# Patient Record
Sex: Male | Born: 1955 | Race: White | Hispanic: No | Marital: Single | Smoking: Never smoker
Health system: Southern US, Community
[De-identification: ages and names within clinical notes are randomized; demographics above are authoritative.]

## PROBLEM LIST (undated history)

## (undated) DIAGNOSIS — I10 Essential (primary) hypertension: Secondary | ICD-10-CM

## (undated) HISTORY — PX: ANKLE SURGERY: SHX546

---

## 1999-09-18 ENCOUNTER — Encounter: Payer: Self-pay | Admitting: *Deleted

## 1999-09-18 ENCOUNTER — Encounter: Admission: RE | Admit: 1999-09-18 | Discharge: 1999-09-18 | Payer: Self-pay | Admitting: *Deleted

## 1999-09-26 ENCOUNTER — Encounter: Admission: RE | Admit: 1999-09-26 | Discharge: 1999-11-23 | Payer: Self-pay | Admitting: *Deleted

## 2001-01-07 ENCOUNTER — Encounter: Payer: Self-pay | Admitting: Internal Medicine

## 2001-01-07 ENCOUNTER — Ambulatory Visit (HOSPITAL_COMMUNITY): Admission: RE | Admit: 2001-01-07 | Discharge: 2001-01-07 | Payer: Self-pay | Admitting: Internal Medicine

## 2001-05-22 ENCOUNTER — Encounter: Admission: RE | Admit: 2001-05-22 | Discharge: 2001-08-20 | Payer: Self-pay | Admitting: Internal Medicine

## 2001-07-08 ENCOUNTER — Encounter: Payer: Self-pay | Admitting: Occupational Medicine

## 2001-07-08 ENCOUNTER — Encounter: Admission: RE | Admit: 2001-07-08 | Discharge: 2001-07-08 | Payer: Self-pay | Admitting: Occupational Medicine

## 2003-01-12 ENCOUNTER — Encounter: Payer: Self-pay | Admitting: Internal Medicine

## 2003-01-12 ENCOUNTER — Encounter: Admission: RE | Admit: 2003-01-12 | Discharge: 2003-01-12 | Payer: Self-pay | Admitting: Internal Medicine

## 2004-04-03 ENCOUNTER — Encounter: Admission: RE | Admit: 2004-04-03 | Discharge: 2004-04-03 | Payer: Self-pay | Admitting: Orthopaedic Surgery

## 2004-05-03 ENCOUNTER — Inpatient Hospital Stay (HOSPITAL_COMMUNITY): Admission: EM | Admit: 2004-05-03 | Discharge: 2004-05-05 | Payer: Self-pay | Admitting: Emergency Medicine

## 2004-05-06 ENCOUNTER — Encounter: Admission: RE | Admit: 2004-05-06 | Discharge: 2004-05-06 | Payer: Self-pay | Admitting: Orthopaedic Surgery

## 2004-05-08 ENCOUNTER — Ambulatory Visit (HOSPITAL_COMMUNITY): Admission: RE | Admit: 2004-05-08 | Discharge: 2004-05-08 | Payer: Self-pay | Admitting: Neurology

## 2004-06-23 ENCOUNTER — Emergency Department (HOSPITAL_COMMUNITY): Admission: EM | Admit: 2004-06-23 | Discharge: 2004-06-23 | Payer: Self-pay | Admitting: Emergency Medicine

## 2004-06-30 ENCOUNTER — Emergency Department (HOSPITAL_COMMUNITY): Admission: EM | Admit: 2004-06-30 | Discharge: 2004-07-01 | Payer: Self-pay | Admitting: *Deleted

## 2004-07-06 ENCOUNTER — Encounter: Admission: RE | Admit: 2004-07-06 | Discharge: 2004-07-06 | Payer: Self-pay | Admitting: Neurology

## 2004-07-14 ENCOUNTER — Emergency Department (HOSPITAL_COMMUNITY): Admission: EM | Admit: 2004-07-14 | Discharge: 2004-07-14 | Payer: Self-pay | Admitting: Emergency Medicine

## 2004-08-30 ENCOUNTER — Ambulatory Visit (HOSPITAL_COMMUNITY): Admission: RE | Admit: 2004-08-30 | Discharge: 2004-08-30 | Payer: Self-pay | Admitting: Neurology

## 2004-09-07 ENCOUNTER — Ambulatory Visit (HOSPITAL_COMMUNITY): Admission: RE | Admit: 2004-09-07 | Discharge: 2004-09-07 | Payer: Self-pay | Admitting: Cardiology

## 2004-09-07 ENCOUNTER — Encounter (INDEPENDENT_AMBULATORY_CARE_PROVIDER_SITE_OTHER): Payer: Self-pay | Admitting: Cardiology

## 2004-10-12 ENCOUNTER — Ambulatory Visit (HOSPITAL_COMMUNITY): Admission: RE | Admit: 2004-10-12 | Discharge: 2004-10-12 | Payer: Self-pay | Admitting: Internal Medicine

## 2005-12-02 ENCOUNTER — Emergency Department (HOSPITAL_COMMUNITY): Admission: EM | Admit: 2005-12-02 | Discharge: 2005-12-02 | Payer: Self-pay | Admitting: Emergency Medicine

## 2005-12-04 ENCOUNTER — Encounter: Admission: RE | Admit: 2005-12-04 | Discharge: 2005-12-04 | Payer: Self-pay | Admitting: Internal Medicine

## 2006-09-13 ENCOUNTER — Emergency Department (HOSPITAL_COMMUNITY): Admission: EM | Admit: 2006-09-13 | Discharge: 2006-09-13 | Payer: Self-pay | Admitting: Emergency Medicine

## 2007-10-12 ENCOUNTER — Emergency Department (HOSPITAL_COMMUNITY): Admission: EM | Admit: 2007-10-12 | Discharge: 2007-10-12 | Payer: Self-pay | Admitting: Emergency Medicine

## 2008-02-18 ENCOUNTER — Observation Stay (HOSPITAL_COMMUNITY): Admission: EM | Admit: 2008-02-18 | Discharge: 2008-02-19 | Payer: Self-pay | Admitting: Emergency Medicine

## 2008-02-18 ENCOUNTER — Ambulatory Visit: Payer: Self-pay | Admitting: Family Medicine

## 2008-03-09 ENCOUNTER — Encounter (HOSPITAL_COMMUNITY): Admission: RE | Admit: 2008-03-09 | Discharge: 2008-04-08 | Payer: Self-pay | Admitting: Internal Medicine

## 2008-03-29 ENCOUNTER — Ambulatory Visit: Payer: Self-pay | Admitting: Cardiology

## 2008-04-27 ENCOUNTER — Ambulatory Visit: Payer: Self-pay | Admitting: Cardiology

## 2009-06-11 ENCOUNTER — Emergency Department (HOSPITAL_COMMUNITY): Admission: EM | Admit: 2009-06-11 | Discharge: 2009-06-12 | Payer: Self-pay | Admitting: Emergency Medicine

## 2009-12-30 ENCOUNTER — Emergency Department (HOSPITAL_COMMUNITY): Admission: EM | Admit: 2009-12-30 | Discharge: 2009-12-30 | Payer: Self-pay | Admitting: Emergency Medicine

## 2010-10-03 ENCOUNTER — Emergency Department (HOSPITAL_COMMUNITY)
Admission: EM | Admit: 2010-10-03 | Discharge: 2010-10-03 | Payer: Self-pay | Source: Home / Self Care | Admitting: Emergency Medicine

## 2010-10-17 ENCOUNTER — Encounter (INDEPENDENT_AMBULATORY_CARE_PROVIDER_SITE_OTHER): Payer: Self-pay | Admitting: *Deleted

## 2010-10-26 NOTE — Letter (Signed)
Summary: Pre Visit Letter Revised  [facility]  [address] [place], [place] [address]   Phone: [phone]  Fax: [phone]        10/17/2010 MRN: [id number] Kenneth Walker [address] [place], [place]  [address]             Procedure Date: 11-28-10   Welcome to the Gastroenterology Division at [facility].    You are scheduled to see a nurse for your pre-procedure visit on 11-14-10 at 10:30A.M. on the 3rd floor at [facility], [address]  We ask that you try to arrive at our office 15 minutes prior to your appointment time to allow for check-in.  Please take a minute to review the attached form.  If you answer "Yes" to one or more of the questions on the first page, we ask that you call the person listed at your earliest opportunity.  If you answer "No" to all of the questions, please complete the rest of the form and bring it to your appointment.    Your nurse visit will consist of discussing your medical and surgical history, your immediate family medical history, and your medications.   If you are unable to list all of your medications on the form, please bring the medication bottles to your appointment and we will list them.  We will need to be aware of both prescribed and over the counter drugs.  We will need to know exact dosage information as well.    Please be prepared to read and sign documents such as consent forms, a financial agreement, and acknowledgement forms.  If necessary, and with your consent, a friend or relative is welcome to sit-in on the nurse visit with you.  Please bring your insurance card so that we may make a copy of it.  If your insurance requires a referral to see a specialist, please bring your referral form from your primary care physician.  No co-pay is required for this nurse visit.     If you cannot keep your appointment, please call [phone] to cancel or reschedule prior to your appointment date.  This allows  [place] the opportunity to schedule an appointment for another patient in need of care.    Thank you for choosing [facility] for your medical needs.  We appreciate the opportunity to care for you.  Please visit [place] at our website  to learn more about our practice.  Sincerely, The Gastroenterology Division

## 2010-11-28 ENCOUNTER — Other Ambulatory Visit: Payer: Self-pay | Admitting: Gastroenterology

## 2010-12-14 LAB — POCT CARDIAC MARKERS
CKMB, poc: 1 ng/mL (ref 1.0–8.0)
Myoglobin, poc: 66.6 ng/mL (ref 12–200)
Troponin i, poc: <0.05 ng/mL (ref 0.00–0.09)

## 2011-01-23 NOTE — Letter (Signed)
March 29, 2008    Kenneth Walker. Kenneth Sills, MD  [address]  [place], [place] [address]   RE:  Kenneth, Walker  MRN:  [id number]  /  DOB:  1956-03-17   Dear Kenneth Walker,   It was my pleasure evaluating Kenneth Walker in the office today at your  request for chest discomfort and an abnormal stress test.  As you know,  this nice gentleman has a history of TIA in 2005.  He developed  expressive aphasia and was evaluated by Dr. Gerilyn Pilgrim.  None of his tests  revealed an etiology for his symptoms.  He has done well since.   He describes some generalized anxiety, but has not been diagnosed with  anxiety disorder.  He has never used psychoactive medications nor does  he use alcohol or illicit drugs.   He developed fairly localized left upper chest pain at work that lasted  most of the day.  This was exacerbated by movement of his left arm.  He  also noted weakness in his left arm such that he was using his right for  most of his tasks.  There is no chest wall tenderness.  There was no  pleuritic component to the discomfort, which was mild-to-moderate.  His  symptoms eventually faded, but he sought evaluation in the emergency  department.  He was kept in hospital overnight.  Myocardial infarction  was ruled out.  He subsequently underwent stress testing, which revealed  a lateral wall defect with some reversibility.   PAST MEDICAL HISTORY:  Notable for mild hypertension, mild diabetes and  mild hyperlipidemia.  All of these are well-controlled with medical  therapy.   Past medical history is positive only for a minor left ankle surgery.   ALLERGIES:  He reports an allergy to PENICILLIN that caused urticaria.   CURRENT MEDICATIONS:  1. Metformin 500 mg b.i.d.  2. Atenolol 25 mg daily.  3. Quinapril 40 mg daily.  4. Aggrenox 200/25 mg b.i.d.  5. Simvastatin 20 mg daily.   SOCIAL HISTORY:  Works as a [employer] fundraiser; unmarried with no  children.  No use of alcohol or tobacco products.   FAMILY  HISTORY:  Father is deceased due to Hodgkin disease; his mother  is alive and well as is one brother.   REVIEW OF SYSTEMS:  Activity has been self-limited lately due to his  chest discomfort and subsequent evaluation.  All other systems are  reviewed and are negative.   PHYSICAL EXAMINATION:  GENERAL:  On exam, pleasant, somewhat overweight  gentleman, in no acute distress.  VITAL SIGNS:  The weight is 282, height 601 inches, heart rate 75 and  regular, and blood pressure 125/85.  HEENT:  Anicteric sclerae; normal oral mucosa.  NECK:  No jugular venous distention; normal carotid upstrokes without  bruits.  ENDOCRINE:  No thyromegaly.  HEMATOPOIETIC:  No adenopathy.  SKIN:  No significant lesions.  LUNGS:  Clear.  CARDIAC:  Normal first and second heart sounds; fourth heart sound  present.  ABDOMEN:  Soft and nontender; no masses; no organomegaly.  EXTREMITIES:  No edema; normal distal pulses.   EKG:  Normal sinus rhythm; within normal limits except for delayed R-  wave progression - cannot exclude previous anterior myocardial  infarction   IMPRESSION:  Mr.  Walker does have multiple cardiovascular risk factors,  but he is relatively young, has a benign exam and a normal EKG.  His  chest discomfort is atypical and most consistent with a musculoskeletal  etiology.  He also reports that his symptoms are exacerbated by anxiety.  I do not think he requires cardiac catheterization at this time.  We  will treat him with  nonsteroidal anti-inflammatory agents and benzodiazepines.  I will  reassess his symptoms in 1 month.   Thank you so much for sending this nice gentleman to see me.    Sincerely,      Gerrit Friends. Dietrich Pates, MD, [facility]  Electronically Signed    RMR/MedQ  DD: 03/29/2008  DT: 03/30/2008  Job #: [id number]

## 2011-01-23 NOTE — Letter (Signed)
April 27, 2008    Kingsley Callander. Ouida Sills, MD  [address]  [place], [place] [address] Val Eagle. Ouida Sills, MD   RE:  [name], [name]  MRN:  [id number]  /  DOB:  1956-06-03   Dear Kenneth Walker,   Mr. Gudino returns to the office for reassessment of chest discomfort.  He continues to have some mild intermittent symptoms that are promptly  relieved with over-the-counter analgesics.  Diabetic control has been  very good as has been control of blood pressure.  His last lipid profile  was excellent on low-dose simvastatin.  He has been using Ativan  approximately once per day with benefit.   Medications are otherwise unchanged from his last visit.   PHYSICAL EXAMINATION:  GENERAL:  [place] gentleman in no acute distress.  VITAL SIGNS:  The weight is 284, 2 pounds more than in July.  Blood  pressure 120/80, heart rate 60 and regular, respirations 14.  NECK:  No jugular venous distention; no carotid bruits.  LUNGS:  Clear.  CARDIAC:  Split first heart sound; normal second heart sound; normal  PMI.  ABDOMEN:  Soft and nontender; no organomegaly.  EXTREMITIES:  Trace edema.   IMPRESSION:  Kenneth Walker is doing well.  There is no compelling reason to  think he has coronary disease or any other cardiac issues.  He is  receiving appropriate medications to minimize his risk of developing  vascular disease.  Please let me know at any time if I can provide  assistance in the care of this nice gentleman.    Sincerely,      Gerrit Friends. Dietrich Pates, MD, [facility]  Electronically Signed    RMR/MedQ  DD: 04/27/2008  DT: 04/28/2008  Job #: [id number]

## 2011-01-23 NOTE — Procedures (Signed)
NAME:  Kenneth Walker, Kenneth Walker NO.:  [id number]   MEDICAL RECORD NO.:  [id number]          PATIENT TYPE:  REC   LOCATION:  RAD                           FACILITY:  [facility]   PHYSICIAN:  Kingsley Callander. Ouida Sills, MD       DATE OF BIRTH:  [birth year]   DATE OF PROCEDURE:  03/09/2008  DATE OF DISCHARGE:                                  STRESS TEST   [name] underwent a Myoview stress test to evaluate recent episodes  of chest pain.  He had been ruled out for an MI at [facility].  He exercised  for 7 minutes 31 seconds (1 minute 31 seconds in stage III of the Bruce  protocol) obtaining a maximal heart rate of 160 (95% at the age  predicted maximal heart rate) and a workload of 10.1 METS and  discontinued exercise after surpassing his target heart rate.  There  were no symptoms of chest pain.  There were no arrhythmias.  There were  no ST-segment changes diagnostic of ischemia.  His baseline EKG revealed  normal sinus rhythm at 72 beats per minute with poor R-wave progression.   IMPRESSION:  No evidence of exercise-induced ischemia.  Myoview images  pending.      Kingsley Callander. Ouida Sills, MD  Electronically Signed     ROF/MEDQ  D:  03/09/2008  T:  03/09/2008  Job:  [id number]

## 2011-01-23 NOTE — H&P (Signed)
NAME:  Kenneth Walker, Kenneth Walker NO.:  [id number]   MEDICAL RECORD NO.:  [id number]          PATIENT TYPE:  INP   LOCATION:  2037                         FACILITY:  [facility]   PHYSICIAN:  Kenneth Walker, M.D.DATE OF BIRTH:  1955/11/03   DATE OF ADMISSION:  02/18/2008  DATE OF DISCHARGE:                              HISTORY & PHYSICAL   CHIEF COMPLAINT:  Chest pain.   PRIMARY CARE PHYSICIAN:  Dr. Kingsley Callander. Ouida Sills, MD, in [place].   HISTORY OF PRESENT ILLNESS:  This is a 55 year old male with history of  diabetes, hypertension, and hyperlipidemia who presents with a one day  history of chest pain characterized as exertional and motion dependent,  i.e., pulling himself into a car closing the back door of his car or  sharp turn of steering brings on pains.  It is also relieved by rest,  i.e., sitting in a car.  Begins in the left superior chest wall and  radiates to the right chest wall.  No radiation to back.  Drive the  steering.  No pressure.  The patient denies diaphoresis, nausea,  vomiting, pleurisy, or fevers.  He has never had similar symptoms like  this before.  The patient states it is very motion dependent.   REVIEW OF SYSTEMS:  No fevers, cough, chills, shortness of breath,  diarrhea, constipation, urinary changes, muscle pain, otherwise no rash.  No vision changes.  Other review of system is negative except for HPI.   MEDICATIONS:  1. Metformin 500 b.i.d.  2. Atenolol 25 mg daily.  3. Quinapril 40 mg daily.  4. Aggrenox 200/25 mg daily.  5. Zocor 20 mg daily.  6. Etodolac 400 mg p.r.n.   ALLERGIES:  Penicillin causes hives.   PAST MEDICAL HISTORY:  1. TIA several times in the past last one in 2006.  2. Hypertension.  3. Diabetes.  4. Arthritis.  5. Hyperlipidemia.   PAST SURGICAL HISTORY:  The patient with history of ankle surgery for  bone spread.   FAMILY HISTORY:  Hypertension and diabetes runs in the family.  The  patient's father died of  Hodgkin disease in 56.  The patient denies  any known family history of heart disease.   SOCIAL HISTORY:  The patient lives in [place] with his mother.  Denies smoking or alcohol.  Denies recreational drugs.   PHYSICAL EXAMINATION:  VITAL SIGNS:  Temperature 98.1, heart rate 83,  respiratory rate 20, blood pressure 149/81, pulse ox 97% on room air.  GENERAL:  Obese, Caucasian male, stocky, in no acute distress.  HEENT:  Pupils equal, round, and reactive to light.  Extraocular  movements intact.  No pharyngeal erythema or edema.  Moist mucous  membranes.  No lymphadenopathy.  CVS:  Normal S1, S2.  No murmurs, rubs, or gallops.  No pain on  palpation of left superior or lateral chest wall, but the patient does  endorse soreness.  PULMONARY:  Clear to auscultation bilaterally.  No crackles or wheezing.  ABDOMEN:  Soft, nontender, nondistended.  No mass.  No  hepatosplenomegaly felt.  EXTREMITIES:  No clubbing, cyanosis, or edema.  SKIN:  No rash or dryness   LABORATORY DATA:  White blood cell 6.1, hemoglobin 12.8, hematocrit  38.4, platelet 278, sodium 121, potassium 3.6, chloride 109, bicarb 23,  BUN 10, creatinine 1.1, glucose 129, myoglobin 126-72, CK-MB 3.2 to less  than 1, troponin I less than 0.05 x2.   IMAGING:  Chest x-ray showing no active lung disease.  EKG within normal  limits.  Poor R-wave progression, unchanged from previous.   ASSESSMENT AND PLAN:  This is a 55 year old male with history of  hypertension, hyperlipidemia, and diabetes who presents with chest pain  which sounds musculoskeletal.  Given risks factor, we will for 24 hours  and rule out for cardiac cause.  1. Chest pain.  Initial, EKG and point of care enzymes x2 not      concerning for cardiac cause; however, the patient does have      significant risk factors for heart disease.  We will admit him.      Risks stratify with the fasting lipid panel in the morning.  Repeat      EKG in the a.m. and  continue to cycle cardiac enzymes.  The patient      is currently on a statin.  Monitored on telemetry.  2. Hypertension.  Continue home regimen atenolol and Quinapril monitor      with vitals.  3. Hyperlipidemia.  Continue statins, check fasting lipid panel.      Currently on low-dose statin.  4. Diabetes.  Continue metformin 500 b.i.d., monitor with CBGs.  5. History of transient ischemic attacks.  The patient states he is      currently on Aggrenox.  This may be possibly due to failed aspirin      treatment previously.  Continue Aggrenox.  6. Arthritis, Tylenol.  7. Fluid, electrolyte, nutrition, heart-healthy, carb modified diet,      hep-lock IV.  8. Pending cardiac rule out.      Eustaquio Boyden, MD  Electronically Signed      Kenneth Bumpers. Leveda Anna, M.D.  Electronically Signed    JG/MEDQ  D:  02/18/2008  T:  02/19/2008  Job:  [id number]

## 2011-01-23 NOTE — Discharge Summary (Signed)
NAME:  Kenneth Walker, Kenneth Walker NO.:  [id number]   MEDICAL RECORD NO.:  [id number]          PATIENT TYPE:  INP   LOCATION:  2037                         FACILITY:  [facility]   PHYSICIAN:  Wayne A. Sheffield Slider, M.D.    DATE OF BIRTH:  1955/10/06   DATE OF ADMISSION:  02/18/2008  DATE OF DISCHARGE:  02/19/2008                               DISCHARGE SUMMARY   PRIMARY CARE PHYSICIAN:  Kingsley Callander. Ouida Sills, MD, [facility].   DISCHARGE DIAGNOSES:  1. Noncoronary chest pain, likely musculoskeletal.  2. Hypertension.  3. Hyperlipidemia.  4. History of transient ischemic attacks.  5. Diabetes.  6. Arthritis.   DISCHARGE MEDICATIONS:  1. Metformin 500 mg b.i.d.  2. Atenolol 25 mg daily.  3. Quinapril 40 mg daily.  4. Aggrenox 200/25 mg b.i.d.  5. Zocor 20 mg daily.  6. Etodolac 400 mg as needed for pain.   CONSULTS:  None.   IMAGING:  Chest x-ray showing no active lung disease.  EKG is with poor  R-wave progression but normal sinus rhythm, unchanged from previous x2.   ADMISSION LABS:  Sodium 141, potassium 3.6, chloride 109, bicarb 23, BUN  10, creatinine 1.1, and glucose 129.  White blood cells 6.1, hemoglobin  12.8, platelets 278, and myoglobin 126 to 72.  CK-MB 3.2 to less than 1.  Troponin I less than 0.05 x2.  Cardiac enzymes negative x3.   DISCHARGE LABS:  Lipid panel fasting showing cholesterol 125,  triglycerides 65, HDL 32, and LDL 80.   HOSPITAL COURSE:  For full summary, please see dictated H and P in  chart.  This is a 55 year old male with history of diabetes,  hypertension, hyperlipidemia, who presented with chest pain.  1. Chest pain.  The patient presented with what sounded like      exertional chest pain relieved with rest but it was not substernal.      Given presentation, the patient was admitted for rule out chest      pain.  He was in fact to be ruled out of cardiac cause with cardiac      enzymes negative x3 and normal EKGs x2.  Given additional risk  factors including hypertension, hyperlipidemia, and diabetes, the      patient would likely benefit from further outpatient workup with an      exercise treadmill test or other stress testing.  The patient      states he is currently plugged into [facility] and would like to      followup as outpatient clinic there.  The patient also to followup      with Dr. Ouida Sills, his PCP.  2. Hypertension.  The patient continued on home medications with good      control of blood pressures.  3. Hyperlipidemia.  The patient on Zocor 20.  Fasting lipid panel      showing low HDL at 32 and good LDL at 80.  4. Diabetes.  The patient continued on metformin with good tolerance.      Also, controlled with diet.   FOLLOWUP:  The patient to follow up with Dr.  Fagan and to call next week  for an appointment.  The patient also to pursue further outpatient  testing of cardiac cause of chest pain, either at [place] [facility] [place] or in  [place].  The patient understands and amenable to this.      Eustaquio Boyden, MD  Electronically Signed      Arnette Norris. Sheffield Slider, M.D.  Electronically Signed    JG/MEDQ  D:  02/19/2008  T:  02/20/2008  Job:  [id number]   cc:   Kingsley Callander. Ouida Sills, MD

## 2011-01-26 NOTE — Discharge Summary (Signed)
NAME:  Kenneth Walker, LINKER NO.:  [id number]   MEDICAL RECORD NO.:  [id number]                   PATIENT TYPE:   LOCATION:                                       FACILITY:   PHYSICIAN:  Kingsley Callander. Ouida Sills, M.D.                  DATE OF BIRTH:   DATE OF ADMISSION:  05/03/2004  DATE OF DISCHARGE:  05/05/2004                                 DISCHARGE SUMMARY   DISCHARGE DIAGNOSES:  1. Transient ischemic attack.  2. Type 2 diabetes.  3. Hypertension.   PROCEDURES:  1. CT scan of the head.  2. MRI of the brain.  3. Carotid ultrasound.  4. Echocardiogram.   HOSPITAL COURSE:  This patient is a 55 year old white male who presented to  the emergency room with expressive aphasia.  His symptoms resolved after  approximately four hours.  An initial CT scan of the head was negative.  He  was hospitalized in a monitored setting.  There were no cardiac arrhythmias.  A carotid ultrasound revealed no hemodynamically significant stenoses.  An  MRI of the brain revealed small vessel ischemic changes only.  He was seen  in neurology consultation by Dr. Gerilyn Pilgrim.  An echocardiogram and an EEG  plus a homocystine level and fasting lipid profile are pending.  He was  treated with Plavix instead of aspirin.  He had been on Accupril and  Atenolol at home.  He experienced a low-grade fever.  His chest x-ray was  negative.  His urinalysis was negative.  There were no localizing findings  on his physical exam.  He was afebrile and stable for discharge on the  evening of May 05, 2004.   He will be seen in followup in the office in one week.  He will come back  tomorrow for an EEG.   He was instructed to return promptly to the emergency room for any recurrent  aphasia or other stroke type symptoms including weakness or sensory changes  in an arm or a leg.   DISCHARGE MEDICATIONS:  1. Plavix 75 mg every day.  2. Accupril 40 mg every day.  3. Atenolol 25 mg every day.  4.  Glucophage 500 mg b.i.d.   He will be seen in the office in one week.    ___________________________________________                                         Kingsley Callander. Ouida Sills, M.D.   ROF/MEDQ  D:  05/05/2004  T:  05/06/2004  Job:  [id number]

## 2011-01-26 NOTE — Procedures (Signed)
NAME:  Kenneth Walker, Kenneth Walker NO.:  [id number]   MEDICAL RECORD NO.:  [id number]                   PATIENT TYPE:  INP   LOCATION:  A226                                 FACILITY:  [facility]   PHYSICIAN:  Richard A. Alanda Amass, M.D.          DATE OF BIRTH:  [birth year]   DATE OF PROCEDURE:  05/05/2004  DATE OF DISCHARGE:                                  ECHOCARDIOGRAM   TECHNICAL QUALITY:  Technically adequate.   HISTORY:  This 55 year old gentleman has a history of hypertension and  possible transient ischemic attack.   FINDINGS:  1. The aorta is normal at 3.5 cm.  2. The aortic valve has three leaflets with normal opening.  There is no     aortic stenosis and no significant AI present.  3. The valves appear thin and trileaflet.  4. The left atrium is enlarged to 4.8 cm which is mildly to moderately     enlarged.  There are no left atrial clots seen, and the patient was in     sinus rhythm during the study.  5. IVS and LVPW are minimally asymmetrically thickened to 1.3 and 1.1 cm     each.  There is normal contraction pattern.  6. Left ventricular internal dimension are WNL.  7. LVIDD equals 4.6 cm, LVISD equals 3.2 cm.  There is normal thickening of     all visualized segments, and no segmental wall motion abnormalities seen.     The estimated EF is greater than 55%.  LV inflow signal is normal with no     evidence of diastolic relaxation abnormality.  8. Mitral valve opens normally.  The leaflets are thin, and there is no     mitral valve prolapse.  There is trivial MR present.  9. Tricuspid valve is normal, and there is mild TR present.  10.      The right ventricle appears normal in thickness and in size.  11.      Normal ejection fraction.  12.      The right atrium appears normal.  The tricuspid and mitral valves     are normally positioned.  13.      There is no pericardial effusion.  14.      Pulmonary valve appears normal with no significant PI.  15.       There is thinning of the intra-atrial septum normally near the     fossa, but there is no ASD seen, and no flow across the atrial septum     seen.  16.      There is no pericardial effusion.   A 2-D echocardiogram is essentially within normal limits.  There is no  significant valvular abnormality.  Normal RV and LV systolic function and no  evidence of diastolic relaxation abnormality.  There is very minimal  asymmetric  septal hypertrophy which may not be outside the limits of normal.  There are  no atrial clots seen.  No source of thrombus noted.   If there is further clinical concern, TEE may be helpful.      ___________________________________________                                            Pearletha Furl. Alanda Amass, M.D.   RAW/MEDQ  D:  05/05/2004  T:  05/06/2004  Job:  [id number]   cc:   Kingsley Callander. Ouida Sills, M.D.  [address]  [place]  [place] [address]  Fax: [phone]   Kofi A. Gerilyn Pilgrim, M.D.  [address] [place]  [place]  [place] [address]  Fax: [phone]

## 2011-01-26 NOTE — H&P (Signed)
NAME:  GURSHAAN, MATSUOKA NO.:  [id number]   MEDICAL RECORD NO.:  [id number]                   PATIENT TYPE:  EMS   LOCATION:  ED                                   FACILITY:  [facility]   PHYSICIAN:  Edward L. Juanetta Gosling, M.D.             DATE OF BIRTH:  10-01-1955   DATE OF ADMISSION:  05/03/2004  DATE OF DISCHARGE:                                HISTORY & PHYSICAL   REASON FOR ADMISSION:  Aphasia.   HISTORY:  Mr. Schlarb is a 55 year old patient of Dr. Alonza Smoker.  He came to  the emergency room with the acute onset of aphasia.  The aphasia was very  unusual in that he has episodes of what sounds like almost stuttering speech  and then he is fluent.  He knows that he is having difficulty with getting  words.  He seems to be searching for words.  He has had a CT in the  emergency room that was normal.   PAST MEDICAL HISTORY:  Positive for hypertension and diabetes.  He has had  an episode of what he was told was heat stroke, but it is not clear exactly  when that was.  He is a little confused about the history.   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  He is a nonsmoker.  He does not use any alcohol.   FAMILY HISTORY:  Negative for any sort of similar problems.   REVIEW OF SYSTEMS:  Otherwise essentially negative.  He has had no numbness,  no pain and no other symptoms.   PHYSICAL EXAMINATION:  VITAL SIGNS:  Blood pressure 150/95, pulse 100,  respirations 24, temperature 98.7 degrees.  HEENT:  Pupils are reactive to light and accommodation.  He does not have  any papilledema.  His nose and throat are clear.  NECK:  He does not have any bruits.  CHEST:  Clear without wheezing, rhonchi, or rales.  HEART:  Regular without murmur, rub, or gallop.  ABDOMEN:  Soft.  No masses are felt.  EXTREMITIES:  No edema.  CENTRAL NERVOUS SYSTEM:  Exam is grossly intact.  He has normal strength.  His only abnormality is his  speech.  His tongue protrudes midline.   LABORATORY DATA:  White count 7200, hemoglobin 13.3.  Comprehensive  metabolic profile shows a glucose of 150.  The prothrombin time is 12.6 and  PTT 32.   ASSESSMENT:  He has a very atypical pattern of aphasia.  The plan is to go  ahead and put him in the hospital, get serial neurological checks and get an  MRI of the brain and go ahead with a neurological consultation.  Dr. Ouida Sills  will resume his care in the morning.  I am going to go ahead and give him  some Plavix.  I am not going to have him on his regular medications at  this  point.     ___________________________________________                                         Oneal Deputy. Juanetta Gosling, M.D.   Gwenlyn Found  D:  05/03/2004  T:  05/03/2004  Job:  [id number]

## 2011-01-26 NOTE — Consult Note (Signed)
NAME:  JHAMIR, PICKUP NO.:  [id number]   MEDICAL RECORD NO.:  [id number]                   PATIENT TYPE:  INP   LOCATION:  A226                                 FACILITY:  [facility]   PHYSICIAN:  Kofi A. Gerilyn Pilgrim, M.D.              DATE OF BIRTH:  02-15-1956   DATE OF CONSULTATION:  DATE OF DISCHARGE:                                   CONSULTATION   REASON FOR CONSULTATION:  The patient's clinical symptoms is most consistent  with a transient ischemic attack.  Other potential etiologies include  migraine, seizures and psychogenic.  These three other differentials are  less likely however.   RECOMMENDATIONS:  In addition to the workup that has been initiated, I am  going to add an echocardiography, fasting lipid profile, homocystine level  and EEG.  I am also going to suggest that aspirin be switched to Plavix.  He  probably does not need both of these combined from a cerebrovascular  standpoint.   HISTORY:  This was a 55 year old Caucasian man who has a history of  diabetes.  He developed the acute onset of difficulty with word finding and  difficulty with speech expression.  He does not report any alteration of  consciousness or problems with what he wants to say.  Essentially, he was  almost mute, just able to say a few words.  He denies any diplopia, visual  disturbance, numbness or weakness.  The patient reportedly had a headache  afterwards.  The headaches are located in bifrontal region.  He denies any  headache at baseline.  He apparently reportedly he got hot and heated  yesterday, and he believes this may have contributed to the event.  The  entire aphasic event apparently lasted for about 3-1/2 hours.  He is now  back to baseline, but is left with residual bifrontal headache.  The patient  has been taking aspirin, 81 mg at home on a consistent basis for this event.   PAST MEDICAL HISTORY:  1. Hypertension.  2. Diabetes.  3. He reports having  a history of a heat stroke in the past.   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  He is a  nonsmoker.  No alcohol use.   FAMILY HISTORY:  Unremarkable.   REVIEW OF SYSTEMS:  As in history of present illness.  Other is  unremarkable.   PHYSICAL EXAMINATION:  GENERAL:  Obese gentleman, in no acute distress.  VITAL SIGNS:  Temperature 100.3.  Pulse 60.  Respirations 20.  Blood  pressure 135/70.  HEENT:  Evaluation shows head is normocephalic, atraumatic.  NECK:  Supple.  LUNGS:  Clear to auscultation bilaterally.  CARDIOVASCULAR:  Exam revealed normal S1 and S2.  ABDOMEN:  Obese but soft.  EXTREMITIES:  No varicosities or edema.  NEUROLOGIC:  Mental status:  The patient is awake, alert. He converses well.  There is no difficulty  with speech, language, comprehension or language  expression.  Naming and repetition are intact.  Cranial nerves II-XII are  intact including visual fields.  Motor examination shows normal tone, bulk  and strength.  There is no pronator drift.  Coordination is intact.  Reflexes are +2 and symmetric.  Plantar reflexes downgoing.  Sensory  examination is normal to light touch and temperature.  Gait is normal.   Images of the brain with CT scan shows no acute findings.  Carotid duplex  Doppler shows normal velocities with no plaque seen.   MRI of the brain is essentially unremarkable.  There are two tiny little  subcortical white matter ischemic changes. There is no adenopathy noted on  diffusion weighted imaging.   WBC is unremarkable other than hematocrit is slightly low at 38.  Chemistry  shows a glucose of 150, otherwise unremarkable, including normal liver  enzymes.      ___________________________________________                                            Perlie Gold Gerilyn Pilgrim, M.D.   KAD/MEDQ  D:  05/04/2004  T:  05/04/2004  Job:  [id number]

## 2011-01-26 NOTE — Procedures (Signed)
NAME:  HERO, KULISH NO.:  [id number]   MEDICAL RECORD NO.:  [id number]                   PATIENT TYPE:   LOCATION:                                       FACILITY:   PHYSICIAN:  Kofi A. Gerilyn Pilgrim, M.D.              DATE OF BIRTH:   DATE OF PROCEDURE:  DATE OF DISCHARGE:                                EEG INTERPRETATION   HISTORY:  This is a 55 year old who presents with episodic speech impairment  and other neurological symptoms suggestive of possible seizures.   ANALYSIS:  A 16 channel recording is conducted for approximately 29 minutes  with a posterior rhythm of 9-1/2 to 10 Hz with attenuation with eye opening.  There is beta activity seen in the frontal areas.  Awake and sleep  activities are noted.  Stage 2 sleep with K complexes and sleep spindles are  noted.  Photic stimulation and hypoventilation does not elicit any abnormal  responses. There is no focal slowing or lateralized slowing or epileptiform  activity noted.   IMPRESSION:  This is a normal recording in the awake and sleep states.      ___________________________________________                                            Perlie Gold Gerilyn Pilgrim, M.D.   KAD/MEDQ  D:  05/08/2004  T:  05/08/2004  Job:  [id number]

## 2011-01-26 NOTE — Group Therapy Note (Signed)
NAME:  Kenneth Walker, TETERS NO.:  [id number]   MEDICAL RECORD NO.:  [id number]                   PATIENT TYPE:  INP   LOCATION:  A226                                 FACILITY:  [facility]   PHYSICIAN:  Kofi A. Gerilyn Pilgrim, M.D.              DATE OF BIRTH:  08-29-1956   DATE OF PROCEDURE:  DATE OF DISCHARGE:                                   PROGRESS NOTE   The patient apparently had a temperature overnight of 102.3.  He feels fine  today and actually wants to go home.  He does not report any neck pain or  headaches.  The workup for the temperature has been unrevealing so far.  He  has had a urinalysis and chest x-ray.  Blood cultures are still pending.  Echocardiography was done, this is pending.  EEG apparently could not be  done until tomorrow.  I think we should continue with the antiplatelet agent or the  recommendations made yesterday.  The EEG results will be followed up.  The  fever is somewhat curious and it is unclear what to make of the fever at  this time.  Certainly if he has another temperature, he needs to be worked  up and it was suggested that a spinal to be done if he was to spike another  temperature.      ___________________________________________                                            Perlie Gold Gerilyn Pilgrim, M.D.   KAD/MEDQ  D:  05/05/2004  T:  05/05/2004  Job:  [id number]

## 2011-06-01 LAB — COMPREHENSIVE METABOLIC PANEL
AST: 25
CO2: 28
Chloride: 105
Creatinine, Ser: 0.9
GFR calc Af Amer: >60
GFR calc non Af Amer: >60
Total Bilirubin: 0.5

## 2011-06-01 LAB — CBC
HCT: 38 — ABNORMAL LOW
Hemoglobin: 13
MCHC: 34.3
MCV: 82.9
Platelets: 216
RBC: 4.59
RDW: 14.6
WBC: 7.7

## 2011-06-01 LAB — LIPASE, BLOOD: Lipase: 29

## 2011-06-01 LAB — URINALYSIS, ROUTINE W REFLEX MICROSCOPIC
Bilirubin Urine: NEGATIVE
Ketones, ur: NEGATIVE
Nitrite: NEGATIVE
Protein, ur: NEGATIVE
Urobilinogen, UA: 0.2

## 2011-06-01 LAB — DIFFERENTIAL
Basophils Absolute: 0
Basophils Relative: 1
Eosinophils Absolute: 0.2
Eosinophils Relative: 2
Lymphocytes Relative: 24

## 2011-06-07 LAB — POCT CARDIAC MARKERS
CKMB, poc: 3.2
Myoglobin, poc: 126
Operator id: 295021
Troponin i, poc: <0.05
Troponin i, poc: <0.05

## 2011-06-07 LAB — DIFFERENTIAL
Basophils Absolute: 0
Basophils Relative: 0
Eosinophils Absolute: 0.1
Eosinophils Relative: 1
Lymphocytes Relative: 30
Lymphs Abs: 1.9
Monocytes Absolute: 0.6
Monocytes Relative: 10
Neutro Abs: 3.6
Neutrophils Relative %: 58

## 2011-06-07 LAB — CARDIAC PANEL(CRET KIN+CKTOT+MB+TROPI)
CK, MB: 1.7
CK, MB: 2.1
Relative Index: 1.6
Relative Index: 1.6
Total CK: 107
Total CK: 132

## 2011-06-07 LAB — CBC
HCT: 38.4 — ABNORMAL LOW
Hemoglobin: 12.8 — ABNORMAL LOW
MCHC: 33.2
MCV: 82.1
Platelets: 278
RBC: 4.68
RDW: 15.9 — ABNORMAL HIGH
WBC: 6.1

## 2011-06-07 LAB — POCT I-STAT, CHEM 8
BUN: 10
Calcium, Ion: 1.13
Chloride: 109
Creatinine, Ser: 1.1
Glucose, Bld: 129 — ABNORMAL HIGH
HCT: 39
Hemoglobin: 13.3
Potassium: 3.6
Sodium: 141
TCO2: 23

## 2011-06-07 LAB — LIPID PANEL
HDL: 32 — ABNORMAL LOW
Total CHOL/HDL Ratio: 3.9
Triglycerides: 65
VLDL: 13

## 2011-06-07 LAB — BASIC METABOLIC PANEL
BUN: 12
Calcium: 9
GFR calc non Af Amer: >60
Glucose, Bld: 142 — ABNORMAL HIGH
Potassium: 3.7

## 2011-10-17 ENCOUNTER — Emergency Department (HOSPITAL_COMMUNITY): Payer: Non-veteran care

## 2011-10-17 ENCOUNTER — Emergency Department (HOSPITAL_COMMUNITY)
Admission: EM | Admit: 2011-10-17 | Discharge: 2011-10-17 | Disposition: A | Discharge status: Home / Self Care | Payer: Non-veteran care | Attending: Emergency Medicine | Admitting: Emergency Medicine

## 2011-10-17 ENCOUNTER — Encounter (HOSPITAL_COMMUNITY): Payer: Self-pay | Admitting: *Deleted

## 2011-10-17 DIAGNOSIS — R197 Diarrhea, unspecified: Secondary | ICD-10-CM | POA: Insufficient documentation

## 2011-10-17 DIAGNOSIS — E119 Type 2 diabetes mellitus without complications: Secondary | ICD-10-CM | POA: Insufficient documentation

## 2011-10-17 DIAGNOSIS — I10 Essential (primary) hypertension: Secondary | ICD-10-CM | POA: Insufficient documentation

## 2011-10-17 DIAGNOSIS — R6883 Chills (without fever): Secondary | ICD-10-CM | POA: Insufficient documentation

## 2011-10-17 DIAGNOSIS — R110 Nausea: Secondary | ICD-10-CM | POA: Insufficient documentation

## 2011-10-17 HISTORY — DX: Essential (primary) hypertension: I10

## 2011-10-17 LAB — COMPREHENSIVE METABOLIC PANEL
ALT: 24 U/L (ref 0–53)
Albumin: 3.6 g/dL (ref 3.5–5.2)
Alkaline Phosphatase: 70 U/L (ref 39–117)
Calcium: 9.3 mg/dL (ref 8.4–10.5)
GFR calc Af Amer: >90 mL/min (ref >90)
Glucose, Bld: 207 mg/dL — ABNORMAL HIGH (ref 70–99)
Potassium: 3.5 mEq/L (ref 3.5–5.1)
Sodium: 136 mEq/L (ref 135–145)
Total Protein: 7.7 g/dL (ref 6.0–8.3)

## 2011-10-17 LAB — DIFFERENTIAL
Basophils Absolute: 0 10*3/uL (ref 0.0–0.1)
Basophils Relative: 0 % (ref 0–1)
Eosinophils Absolute: 0 10*3/uL (ref 0.0–0.7)
Neutro Abs: 3.4 10*3/uL (ref 1.7–7.7)
Neutrophils Relative %: 67 % (ref 43–77)

## 2011-10-17 LAB — CBC
Hemoglobin: 14.8 g/dL (ref 13.0–17.0)
MCH: 28.5 pg (ref 26.0–34.0)
MCHC: 33 g/dL (ref 30.0–36.0)
Platelets: 225 10*3/uL (ref 150–400)
RDW: 12.8 % (ref 11.5–15.5)

## 2011-10-17 LAB — GLUCOSE, CAPILLARY: Glucose-Capillary: 168 mg/dL — ABNORMAL HIGH (ref 70–99)

## 2011-10-17 LAB — URINALYSIS, ROUTINE W REFLEX MICROSCOPIC
Bilirubin Urine: NEGATIVE
Glucose, UA: NEGATIVE mg/dL
Ketones, ur: NEGATIVE mg/dL
Leukocytes, UA: NEGATIVE
Nitrite: NEGATIVE
Specific Gravity, Urine: >1.03 — ABNORMAL HIGH (ref 1.005–1.030)
pH: 5 (ref 5.0–8.0)

## 2011-10-17 LAB — LIPASE, BLOOD: Lipase: 20 U/L (ref 11–59)

## 2011-10-17 MED ORDER — SODIUM CHLORIDE 0.9 % IV SOLN
INTRAVENOUS | Status: DC
  Administered 2011-10-17: 15:00:00 via INTRAVENOUS

## 2011-10-17 MED ORDER — ONDANSETRON 8 MG PO TBDP
8.0000 mg | ORAL_TABLET | Freq: Once | ORAL | Status: AC
  Administered 2011-10-17: 8 mg via ORAL
  Filled 2011-10-17: qty 1

## 2011-10-17 MED ORDER — IOHEXOL 300 MG/ML  SOLN
100.0000 mL | Freq: Once | INTRAMUSCULAR | Status: AC | PRN
  Administered 2011-10-17: 100 mL via INTRAVENOUS

## 2011-10-17 MED ORDER — PROMETHAZINE HCL 25 MG PO TABS: 12.5000 mg | ORAL_TABLET | Freq: Four times a day (QID) | ORAL | Status: AC | PRN

## 2011-10-17 NOTE — ED Provider Notes (Signed)
History   This chart was scribed for Laray Anger, DO by Clarita Crane. The patient was seen in room APA08/[facility]08 and the patient's care was started at 2:00PM.   CSN: [id number]  Arrival date & time 10/17/11  1046   First MD Initiated Contact with Patient 10/17/11 1342      Chief Complaint  Patient presents with  . Chills    HPI Patient seen at 2:00PM Kenneth Walker is a 56 y.o. male who presents to the Emergency Department complaining of gradual onset and persistence of multiple intermittent episodes of "chills" that began 3 days ago.  Chills have been assoc with decreased appetite, nausea and several episodes of loose stools.  Denies fever, no vomiting, no CP/SOB, no cough, no rash, no black or blood in stools.    Past Medical History  Diagnosis Date  . Hypertension   . Diabetes mellitus     Past Surgical History  Procedure Date  . Ankle surgery     History  Substance Use Topics  . Smoking status: Never Smoker   . Smokeless tobacco: Not on file  . Alcohol Use: No    Review of Systems ROS: Statement: All systems negative except as marked or noted in the HPI; Constitutional: Negative for fever and +chills, decreased appetite.; ; Eyes: Negative for eye pain, redness and discharge. ; ; ENMT: Negative for ear pain, hoarseness, sinus pressure, sore throat, +runny/stuffy nose; Cardiovascular: Negative for chest pain, palpitations, diaphoresis, dyspnea and peripheral edema. ; ; Respiratory: Negative for cough, wheezing and stridor. ; ; Gastrointestinal: +nausea, diarrhea. Negative for vomiting, abdominal pain, blood in stool, hematemesis, jaundice and rectal bleeding.; ; Genitourinary: Negative for dysuria, flank pain and hematuria. ; ; Musculoskeletal: Negative for back pain and neck pain. Negative for swelling and trauma.; ; Skin: Negative for pruritus, rash, abrasions, blisters, bruising and skin lesion.; ; Neuro: Negative for headache, lightheadedness and neck stiffness.  Negative for weakness, altered level of consciousness , altered mental status, extremity weakness, paresthesias, involuntary movement, seizure and syncope.     Allergies  Penicillins  Home Medications  No current outpatient prescriptions on file.  BP 158/99  Pulse 73  Temp(Src) 98.5 F (36.9 C) (Oral)  Resp 16  Ht 6\' 1"  (1.854 m)  Wt 230 lb (104.327 kg)  BMI 30.34 kg/m2  SpO2 99%  Physical Exam Exam performed at 2:01PM Physical examination:  Nursing notes reviewed; Vital signs and O2 SAT reviewed;  Constitutional: Well developed, Well nourished, Well hydrated, In no acute distress; Head:  Normocephalic, atraumatic; Eyes: EOMI, PERRL, No scleral icterus; ENMT: Mouth and pharynx normal, Mucous membranes moist; Neck: Supple, Full range of motion, No lymphadenopathy; Cardiovascular: Regular rate and rhythm, No murmur, rub, or gallop; Respiratory: Breath sounds clear & equal bilaterally, No rales, rhonchi, wheezes, or rub, Normal respiratory effort/excursion; Chest: Nontender, Movement normal; Abdomen: Soft, Nontender, Nondistended, Normal bowel sounds; Extremities: Pulses normal, No tenderness, No edema, No calf edema or asymmetry.; Neuro: AA&Ox3, Major CN grossly intact.  No gross focal motor or sensory deficits in extremities.; Skin: Color normal, Warm, Dry, no rash.    ED Course  Procedures    MDM  MDM Reviewed: nursing note and vitals Interpretation: labs and x-ray     Results for orders placed during the hospital encounter of 10/17/11  GLUCOSE, CAPILLARY      Component Value Range   Glucose-Capillary 168 (*) 70 - 99 (mg/dL)  LIPASE, BLOOD      Component Value Range   Lipase  20  11 - 59 (U/L)  CBC      Component Value Range   WBC 5.1  4.0 - 10.5 (K/uL)   RBC 5.20  4.22 - 5.81 (MIL/uL)   Hemoglobin 14.8  13.0 - 17.0 (g/dL)   HCT 16.1  09.6 - 04.5 (%)   MCV 86.2  78.0 - 100.0 (fL)   MCH 28.5  26.0 - 34.0 (pg)   MCHC 33.0  30.0 - 36.0 (g/dL)   RDW 40.9  81.1 - 91.4 (%)    Platelets 225  150 - 400 (K/uL)  DIFFERENTIAL      Component Value Range   Neutrophils Relative 67  43 - 77 (%)   Neutro Abs 3.4  1.7 - 7.7 (K/uL)   Lymphocytes Relative 24  12 - 46 (%)   Lymphs Abs 1.2  0.7 - 4.0 (K/uL)   Monocytes Relative 9  3 - 12 (%)   Monocytes Absolute 0.4  0.1 - 1.0 (K/uL)   Eosinophils Relative 1  0 - 5 (%)   Eosinophils Absolute 0.0  0.0 - 0.7 (K/uL)   Basophils Relative 0  0 - 1 (%)   Basophils Absolute 0.0  0.0 - 0.1 (K/uL)  LACTIC ACID, PLASMA      Component Value Range   Lactic Acid, Venous 1.4  0.5 - 2.2 (mmol/L)  COMPREHENSIVE METABOLIC PANEL      Component Value Range   Sodium 136  135 - 145 (mEq/L)   Potassium 3.5  3.5 - 5.1 (mEq/L)   Chloride 105  96 - 112 (mEq/L)   CO2 20  19 - 32 (mEq/L)   Glucose, Bld 207 (*) 70 - 99 (mg/dL)   BUN 14  6 - 23 (mg/dL)   Creatinine, Ser 7.82  0.50 - 1.35 (mg/dL)   Calcium 9.3  8.4 - 95.6 (mg/dL)   Total Protein 7.7  6.0 - 8.3 (g/dL)   Albumin 3.6  3.5 - 5.2 (g/dL)   AST 21  0 - 37 (U/L)   ALT 24  0 - 53 (U/L)   Alkaline Phosphatase 70  39 - 117 (U/L)   Total Bilirubin 0.3  0.3 - 1.2 (mg/dL)   GFR calc non Af Amer >90  >90 (mL/min)   GFR calc Af Amer >90  >90 (mL/min)  URINALYSIS, ROUTINE W REFLEX MICROSCOPIC      Component Value Range   Color, Urine YELLOW  YELLOW    APPearance CLEAR  CLEAR    Specific Gravity, Urine >1.030 (*) 1.005 - 1.030    pH 5.0  5.0 - 8.0    Glucose, UA NEGATIVE  NEGATIVE (mg/dL)   Hgb urine dipstick NEGATIVE  NEGATIVE    Bilirubin Urine NEGATIVE  NEGATIVE    Ketones, ur NEGATIVE  NEGATIVE (mg/dL)   Protein, ur NEGATIVE  NEGATIVE (mg/dL)   Urobilinogen, UA 0.2  0.0 - 1.0 (mg/dL)   Nitrite NEGATIVE  NEGATIVE    Leukocytes, UA NEGATIVE  NEGATIVE     thenticated By: Juline Patch, M.D.   Ct Abdomen Pelvis W Contrast  10/17/2011  *RADIOLOGY REPORT*  Clinical Data: Nausea, vomiting, diarrhea, abdominal pain, abnormal radiographs  CT ABDOMEN AND PELVIS WITH CONTRAST   Technique:  Multidetector CT imaging of the abdomen and pelvis was performed following the standard protocol during bolus administration of intravenous contrast. Sagittal and coronal MPR images reconstructed from axial data set.  Contrast: OMNIPAQUE IOHEXOL 300 MG/ML IV SOLN Dilute oral contrast.  Comparison: None  Findings: Lung  bases clear. Liver, spleen, pancreas, and adrenal glands normal appearance. Bilateral renal cysts, largest mid left kidney 4.0 x 4.2 cm image 38. Bilateral nonobstructing renal calculi. Fluid-filled colon without obstruction consistent with history of diarrhea. Few uncomplicated sigmoid diverticula noted. Sigmoid wall is minimally prominent, potentially artifact from underdistension. Stomach and small bowel loops normal appearance. Normal appendix. Few mildly prominent periportal lymph nodes, largest 2.9 x 1.6 cm image 26. No mass, free fluid, or definite hernia. No acute osseous findings.  IMPRESSION: Bilateral renal cysts and nonobstructing calculi. Fluid-filled colon consistent with history of diarrhea. Nonspecific mildly enlarged periportal lymph nodes. Otherwise negative exam.  Original Report Authenticated By: Lollie Marrow, M.D.   Dg Abd Acute W/chest  10/17/2011  *RADIOLOGY REPORT*  Clinical Data: Abdominal pain, cough for 3 days  ACUTE ABDOMEN SERIES (ABDOMEN 2 VIEW & CHEST 1 VIEW)  Comparison: Chest x-ray of 06/11/2009  Findings: The lungs are clear.  Mediastinal contours appear normal. The heart is within upper limits of normal.  Supine and erect views of the abdomen show scattered air-fluid levels, several of which are within the colon, but there are scattered air-fluid levels in the small bowel as well.  Although this pattern may reflect ileus, or diarrhea, partial small bowel obstruction cannot be excluded.  No free air is seen.  IMPRESSION:  1.  Scattered air-fluid levels.  Possible ileus or diarrhea, but partial small bowel obstruction cannot be excluded.  No free  air. 2.  No active lung disease.  Original Report Authenticated By: Juline Patch, M.D.    4:01 PM:  CT A/P pending.  Sign out to Dr. Colon Branch, dispo based on results.  1556 CT negative for obstruction.Fluid filled colon consistent with h/o diarrhea.Patient is feeling better. D/C home with Rx for phenergan. Given BRAT diet instruction sheet.     I personally performed the services described in this documentation, which was scribed in my presence. The recorded information has been reviewed and considered. MCMANUS,KATHLEEN Stan Head. Colon Branch, MD 10/17/11 1759

## 2011-10-17 NOTE — ED Notes (Signed)
Pt c/o chills x 3 days. Denies fever. States that he was pale this morning. Pt c/o nausea.

## 2011-10-18 LAB — URINE CULTURE
Colony Count: NO GROWTH
Culture  Setup Time: 201302070155

## 2012-12-24 IMAGING — CT CT ABD-PELV W/ CM
2 of 5 series · 17 of 46 positions shown, 19 images · IV contrast (Omnipaque 300)
Comparison: None

CLINICAL DATA: Nausea, vomiting, diarrhea, abdominal pain, abnormal
radiographs

CT ABDOMEN AND PELVIS WITH CONTRAST
TECHNIQUE: Multidetector CT imaging of the abdomen and pelvis was
performed following the standard protocol during bolus
administration of intravenous contrast. Sagittal and coronal MPR
images reconstructed from axial data set.
Contrast: 100mL OMNIPAQUE IOHEXOL 300 MG/ML IV SOLN Dilute oral
contrast.

[Series 2: abd_pel_with 5.0 b40f · axial · 0.90mm/px · z∈[-457,-32]mm · 14 of 97 slices shown, 16 images]
[im 6/97  soft-tissue]
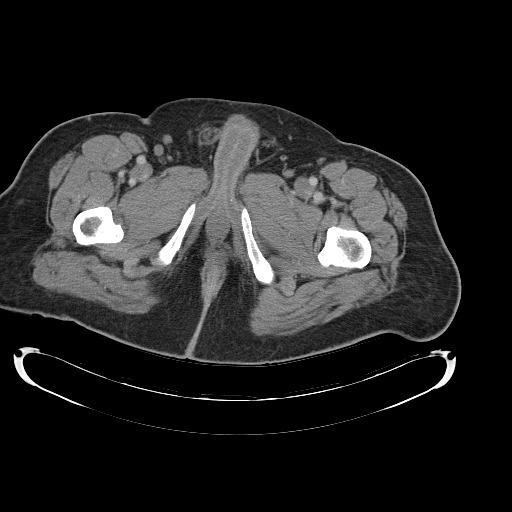
[im 6/97  bone]
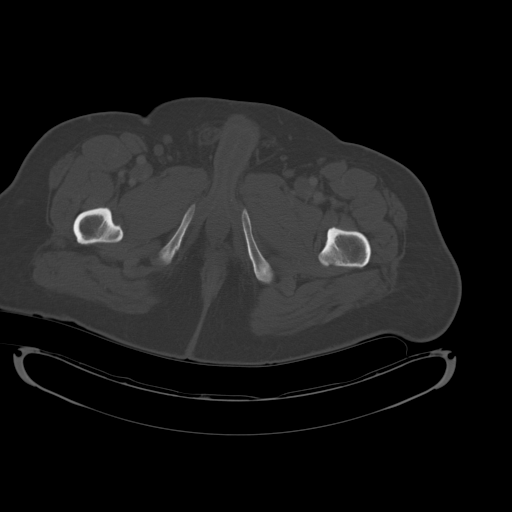
[im 12/97  soft-tissue]
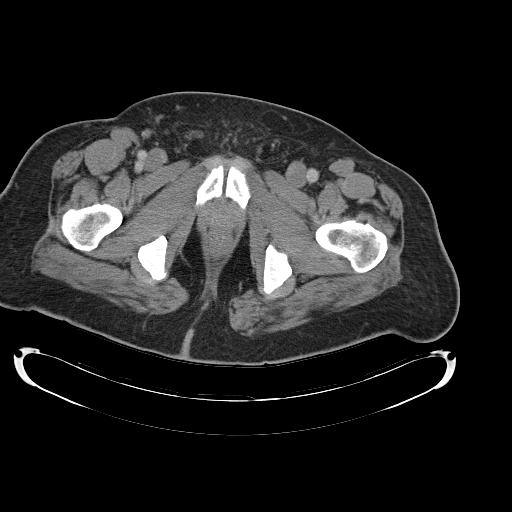
[im 17/97  soft-tissue]
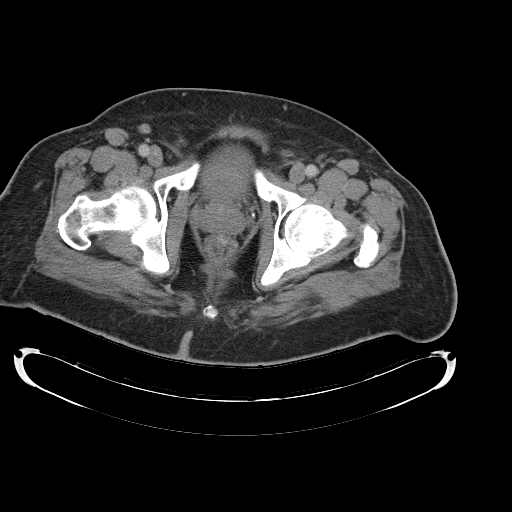
[im 29/97  soft-tissue]
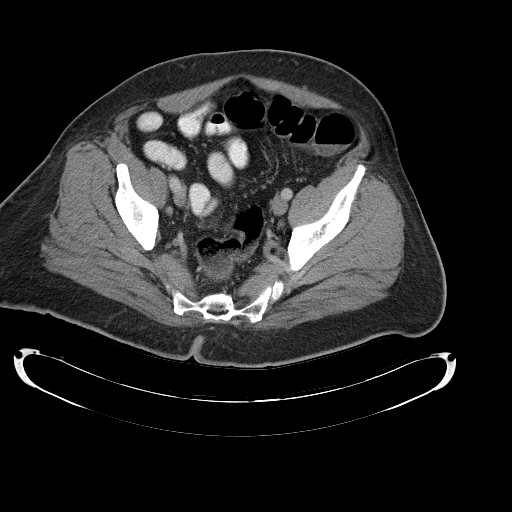
[im 34/97  soft-tissue]
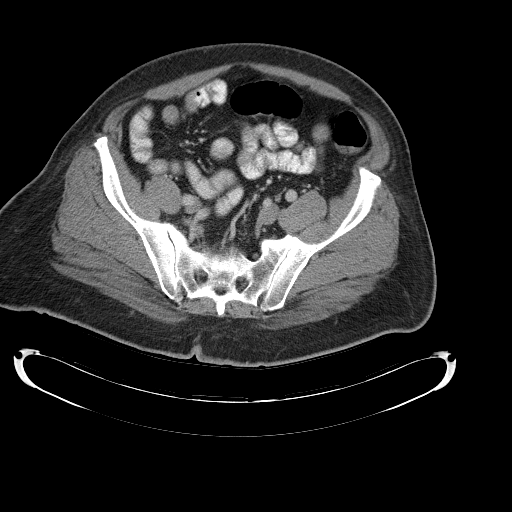
[im 40/97  soft-tissue]
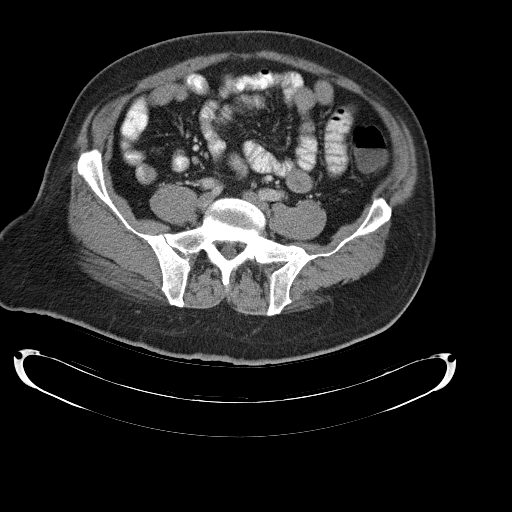
[im 46/97  soft-tissue]
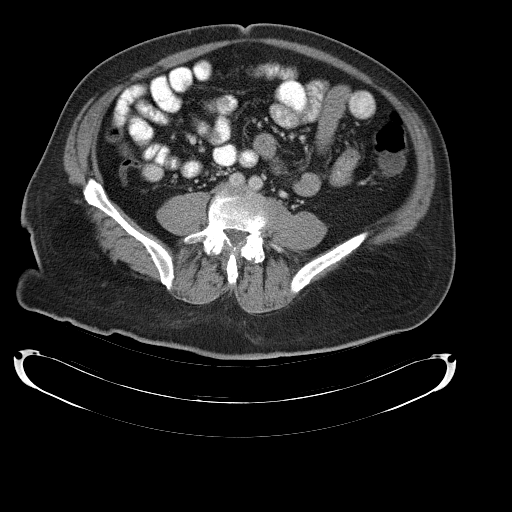
[im 51/97  soft-tissue]
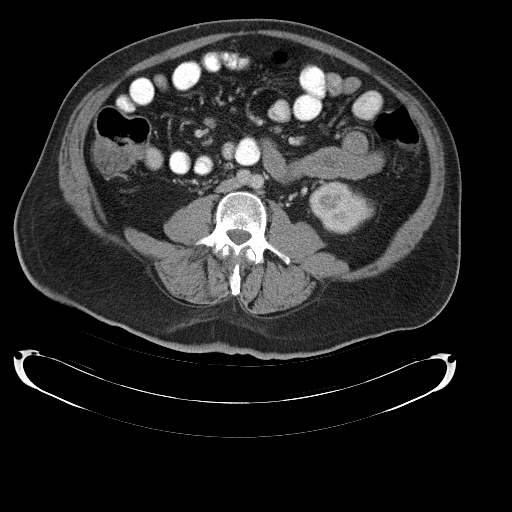
[im 57/97  soft-tissue]
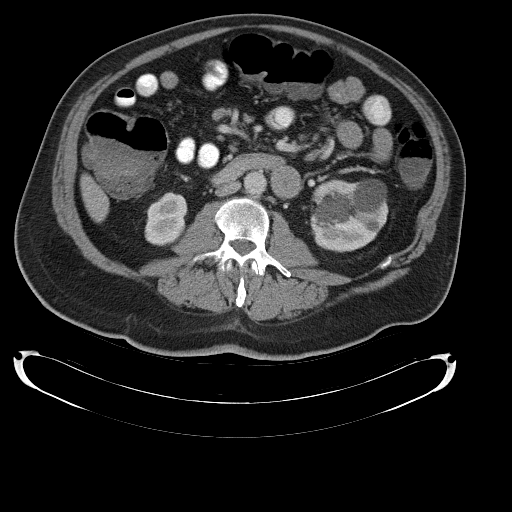
[im 57/97  bone]
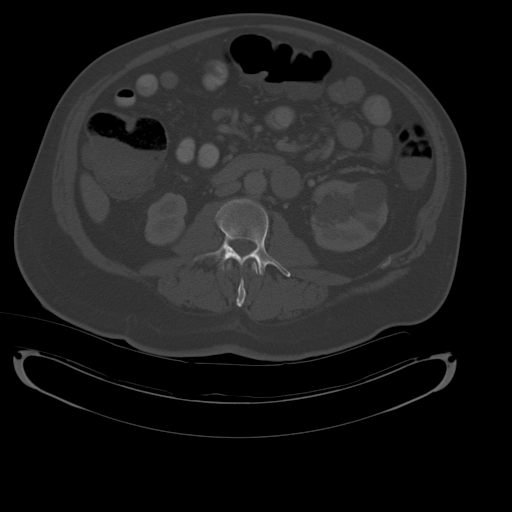
[im 63/97  soft-tissue]
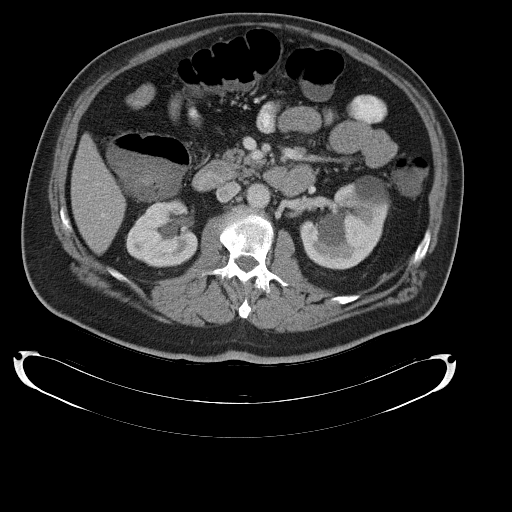
[im 74/97  soft-tissue]
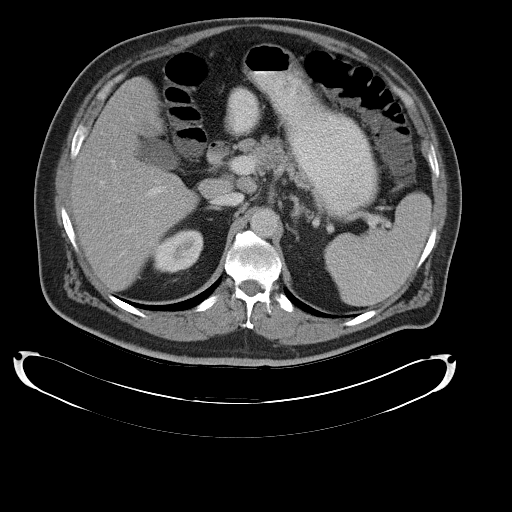
[im 80/97  soft-tissue]
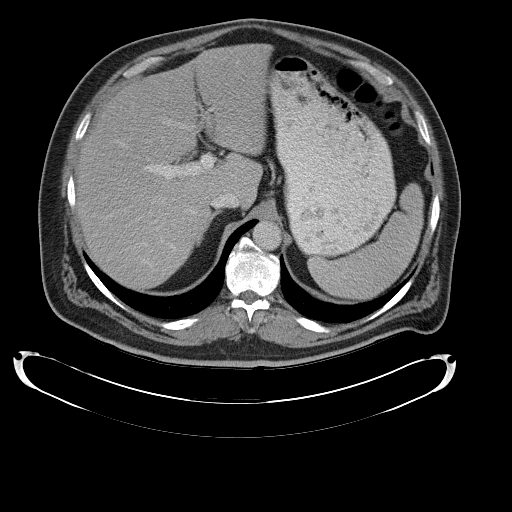
[im 85/97  soft-tissue]
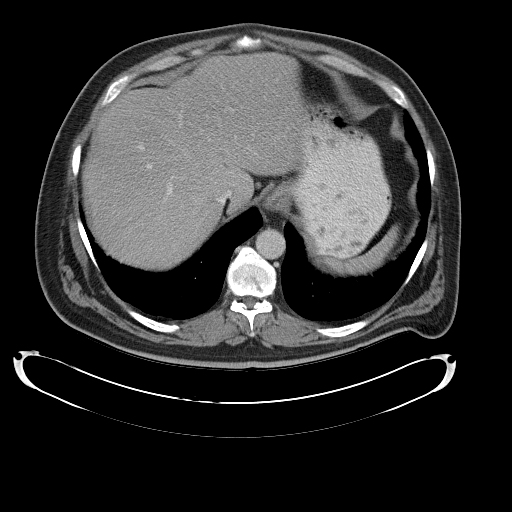
[im 91/97  soft-tissue]
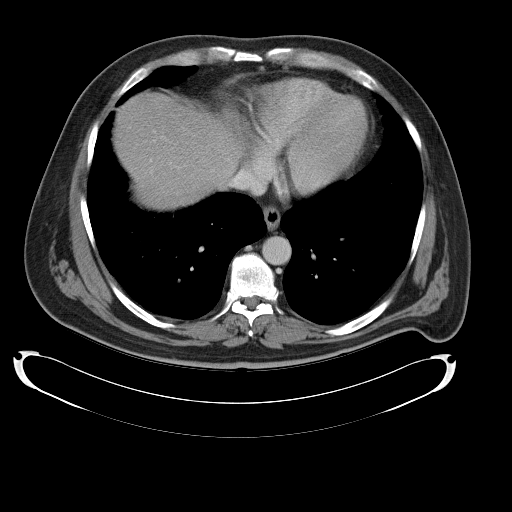

[Series 4: abd_pel_with 3.0 spo cor · coronal · 0.96mm/px · 3 of 92 slices shown]
[im 31/92  soft-tissue]
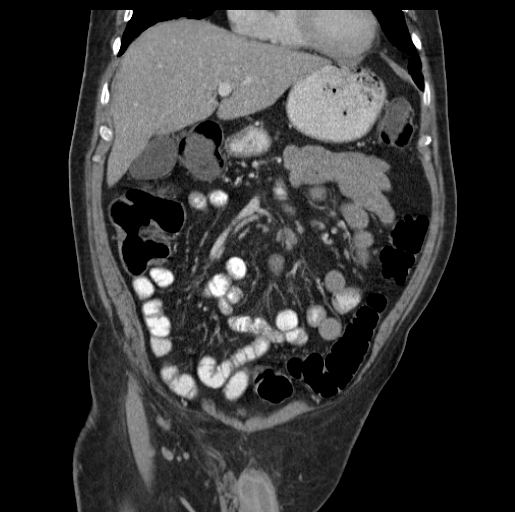
[im 41/92  soft-tissue]
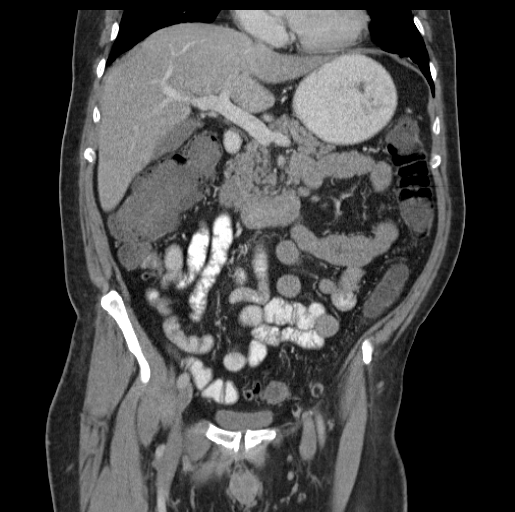
[im 51/92  soft-tissue]
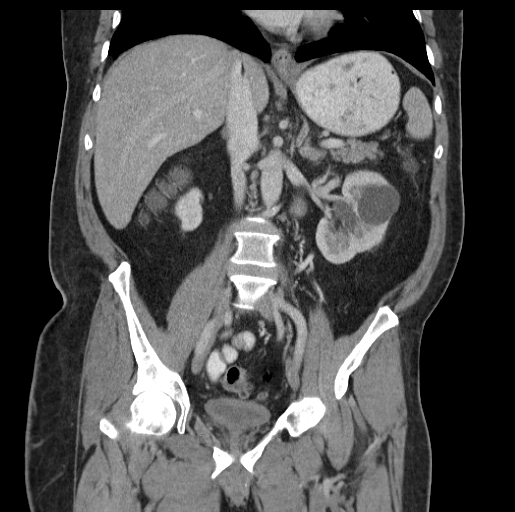

[17 of 46 positions shown; findings below may reference images not displayed]

FINDINGS: Lung bases clear.
Liver, spleen, pancreas, and adrenal glands normal appearance.
Bilateral renal cysts, largest mid left kidney 4.0 x 4.2 cm image
38.
Bilateral nonobstructing renal calculi.
Fluid-filled colon without obstruction consistent with history of
diarrhea.
Few uncomplicated sigmoid diverticula noted.
Sigmoid wall is minimally prominent, potentially artifact from
underdistension.
Stomach and small bowel loops normal appearance.
Normal appendix.
Few mildly prominent periportal lymph nodes, largest 2.9 x 1.6 cm
image 26.
No mass, free fluid, or definite hernia.
No acute osseous findings.
IMPRESSION: Bilateral renal cysts and nonobstructing calculi.
Fluid-filled colon consistent with history of diarrhea.
Nonspecific mildly enlarged periportal lymph nodes.
Otherwise negative exam.

## 2013-03-26 ENCOUNTER — Other Ambulatory Visit: Payer: Self-pay | Admitting: Occupational Medicine

## 2013-03-26 ENCOUNTER — Ambulatory Visit: Payer: Self-pay

## 2013-03-26 DIAGNOSIS — R52 Pain, unspecified: Secondary | ICD-10-CM

## 2014-05-07 ENCOUNTER — Encounter (HOSPITAL_COMMUNITY): Payer: Self-pay | Admitting: Emergency Medicine

## 2014-05-07 ENCOUNTER — Emergency Department (HOSPITAL_COMMUNITY): Payer: Non-veteran care

## 2014-05-07 ENCOUNTER — Inpatient Hospital Stay (HOSPITAL_COMMUNITY)
Admission: EM | Admit: 2014-05-07 | Discharge: 2014-05-10 | DRG: 683 | Disposition: A | Discharge status: Home / Self Care | Payer: Non-veteran care | Attending: Internal Medicine | Admitting: Internal Medicine

## 2014-05-07 DIAGNOSIS — N200 Calculus of kidney: Secondary | ICD-10-CM | POA: Diagnosis present

## 2014-05-07 DIAGNOSIS — M6282 Rhabdomyolysis: Secondary | ICD-10-CM | POA: Diagnosis present

## 2014-05-07 DIAGNOSIS — Z7902 Long term (current) use of antithrombotics/antiplatelets: Secondary | ICD-10-CM | POA: Diagnosis not present

## 2014-05-07 DIAGNOSIS — E876 Hypokalemia: Secondary | ICD-10-CM | POA: Diagnosis present

## 2014-05-07 DIAGNOSIS — E872 Acidosis, unspecified: Secondary | ICD-10-CM | POA: Diagnosis present

## 2014-05-07 DIAGNOSIS — Z79899 Other long term (current) drug therapy: Secondary | ICD-10-CM | POA: Diagnosis not present

## 2014-05-07 DIAGNOSIS — E875 Hyperkalemia: Secondary | ICD-10-CM | POA: Diagnosis present

## 2014-05-07 DIAGNOSIS — I959 Hypotension, unspecified: Secondary | ICD-10-CM | POA: Diagnosis present

## 2014-05-07 DIAGNOSIS — E119 Type 2 diabetes mellitus without complications: Secondary | ICD-10-CM | POA: Diagnosis present

## 2014-05-07 DIAGNOSIS — E1169 Type 2 diabetes mellitus with other specified complication: Secondary | ICD-10-CM | POA: Insufficient documentation

## 2014-05-07 DIAGNOSIS — E669 Obesity, unspecified: Secondary | ICD-10-CM | POA: Insufficient documentation

## 2014-05-07 DIAGNOSIS — R55 Syncope and collapse: Secondary | ICD-10-CM | POA: Diagnosis not present

## 2014-05-07 DIAGNOSIS — K5790 Diverticulosis of intestine, part unspecified, without perforation or abscess without bleeding: Secondary | ICD-10-CM

## 2014-05-07 DIAGNOSIS — I10 Essential (primary) hypertension: Secondary | ICD-10-CM | POA: Diagnosis present

## 2014-05-07 DIAGNOSIS — N179 Acute kidney failure, unspecified: Principal | ICD-10-CM | POA: Diagnosis present

## 2014-05-07 DIAGNOSIS — Z23 Encounter for immunization: Secondary | ICD-10-CM | POA: Diagnosis not present

## 2014-05-07 DIAGNOSIS — E860 Dehydration: Secondary | ICD-10-CM | POA: Diagnosis present

## 2014-05-07 LAB — CBC WITH DIFFERENTIAL/PLATELET
Basophils Absolute: 0 10*3/uL (ref 0.0–0.1)
Basophils Relative: 0 % (ref 0–1)
Eosinophils Absolute: 0 10*3/uL (ref 0.0–0.7)
Eosinophils Relative: 0 % (ref 0–5)
HCT: 39.7 % (ref 39.0–52.0)
HEMOGLOBIN: 13.3 g/dL (ref 13.0–17.0)
LYMPHS ABS: 1.3 10*3/uL (ref 0.7–4.0)
LYMPHS PCT: 9 % — AB (ref 12–46)
MCH: 27.1 pg (ref 26.0–34.0)
MCHC: 33.5 g/dL (ref 30.0–36.0)
MCV: 80.9 fL (ref 78.0–100.0)
MONOS PCT: 4 % (ref 3–12)
Monocytes Absolute: 0.6 10*3/uL (ref 0.1–1.0)
NEUTROS ABS: 12.3 10*3/uL — AB (ref 1.7–7.7)
NEUTROS PCT: 87 % — AB (ref 43–77)
Platelets: 348 10*3/uL (ref 150–400)
RBC: 4.91 MIL/uL (ref 4.22–5.81)
RDW: 14.9 % (ref 11.5–15.5)
WBC: 14.2 10*3/uL — AB (ref 4.0–10.5)

## 2014-05-07 LAB — COMPREHENSIVE METABOLIC PANEL
ALK PHOS: 62 U/L (ref 39–117)
ALT: 33 U/L (ref 0–53)
ANION GAP: 30 — AB (ref 5–15)
AST: 54 U/L — ABNORMAL HIGH (ref 0–37)
Albumin: 4.3 g/dL (ref 3.5–5.2)
BILIRUBIN TOTAL: 0.4 mg/dL (ref 0.3–1.2)
BUN: 74 mg/dL — AB (ref 6–23)
CHLORIDE: 97 meq/L (ref 96–112)
CO2: 13 meq/L — AB (ref 19–32)
Calcium: 10 mg/dL (ref 8.4–10.5)
Creatinine, Ser: 8.12 mg/dL — ABNORMAL HIGH (ref 0.50–1.35)
GFR calc non Af Amer: 6 mL/min — ABNORMAL LOW (ref >90)
GFR, EST AFRICAN AMERICAN: 7 mL/min — AB (ref >90)
GLUCOSE: 154 mg/dL — AB (ref 70–99)
POTASSIUM: 7.2 meq/L — AB (ref 3.7–5.3)
SODIUM: 140 meq/L (ref 137–147)
Total Protein: 8.3 g/dL (ref 6.0–8.3)

## 2014-05-07 LAB — MAGNESIUM: Magnesium: 2 mg/dL (ref 1.5–2.5)

## 2014-05-07 LAB — GLUCOSE, CAPILLARY
GLUCOSE-CAPILLARY: 70 mg/dL (ref 70–99)
Glucose-Capillary: 55 mg/dL — ABNORMAL LOW (ref 70–99)
Glucose-Capillary: 91 mg/dL (ref 70–99)

## 2014-05-07 LAB — I-STAT CG4 LACTIC ACID, ED: Lactic Acid, Venous: 2.89 mmol/L — ABNORMAL HIGH (ref 0.5–2.2)

## 2014-05-07 LAB — MRSA PCR SCREENING: MRSA by PCR: NEGATIVE

## 2014-05-07 LAB — CK: CK TOTAL: 1555 U/L — AB (ref 7–232)

## 2014-05-07 LAB — CBG MONITORING, ED: GLUCOSE-CAPILLARY: 146 mg/dL — AB (ref 70–99)

## 2014-05-07 LAB — TROPONIN I: Troponin I: <0.3 ng/mL (ref <0.30)

## 2014-05-07 LAB — POTASSIUM: POTASSIUM: 5.3 meq/L (ref 3.7–5.3)

## 2014-05-07 MED ORDER — SODIUM POLYSTYRENE SULFONATE 15 GM/60ML PO SUSP
15.0000 g | ORAL | Status: DC
Start: 2014-05-08 — End: 2014-05-08
  Administered 2014-05-08 (×2): 15 g via ORAL
  Filled 2014-05-07 (×2): qty 60

## 2014-05-07 MED ORDER — DEXTROSE 50 % IV SOLN
INTRAVENOUS | Status: AC
  Filled 2014-05-07: qty 50

## 2014-05-07 MED ORDER — SODIUM CHLORIDE 0.9 % IV BOLUS (SEPSIS)
1000.0000 mL | Freq: Once | INTRAVENOUS | Status: AC
  Administered 2014-05-07: 1000 mL via INTRAVENOUS

## 2014-05-07 MED ORDER — INSULIN ASPART 100 UNIT/ML IV SOLN
10.0000 [IU] | Freq: Once | INTRAVENOUS | Status: AC
  Administered 2014-05-07: 10 [IU] via INTRAVENOUS

## 2014-05-07 MED ORDER — PNEUMOCOCCAL VAC POLYVALENT 25 MCG/0.5ML IJ INJ
0.5000 mL | INJECTION | INTRAMUSCULAR | Status: AC
  Administered 2014-05-08: 0.5 mL via INTRAMUSCULAR
  Filled 2014-05-07: qty 0.5

## 2014-05-07 MED ORDER — ONDANSETRON HCL 4 MG/2ML IJ SOLN: 4.0000 mg | Freq: Three times a day (TID) | INTRAMUSCULAR | Status: AC | PRN

## 2014-05-07 MED ORDER — DEXTROSE 50 % IV SOLN
50.0000 mL | Freq: Once | INTRAVENOUS | Status: AC
Start: 2014-05-07 — End: 2014-05-07
  Administered 2014-05-07: 50 mL via INTRAVENOUS

## 2014-05-07 MED ORDER — INSULIN ASPART 100 UNIT/ML ~~LOC~~ SOLN
0.0000 [IU] | Freq: Three times a day (TID) | SUBCUTANEOUS | Status: DC
  Administered 2014-05-08: 5 [IU] via SUBCUTANEOUS
  Administered 2014-05-08: 3 [IU] via SUBCUTANEOUS
  Administered 2014-05-09: 5 [IU] via SUBCUTANEOUS
  Administered 2014-05-09: 2 [IU] via SUBCUTANEOUS
  Administered 2014-05-09: 5 [IU] via SUBCUTANEOUS
  Administered 2014-05-10: 2 [IU] via SUBCUTANEOUS

## 2014-05-07 MED ORDER — STERILE WATER FOR INJECTION IV SOLN
INTRAVENOUS | Status: DC
  Administered 2014-05-07 – 2014-05-09 (×5): via INTRAVENOUS
  Filled 2014-05-07 (×10): qty 850

## 2014-05-07 MED ORDER — SODIUM CHLORIDE 0.9 % IV SOLN
Freq: Once | INTRAVENOUS | Status: AC
  Administered 2014-05-07: 19:00:00 via INTRAVENOUS

## 2014-05-07 MED ORDER — ALBUTEROL SULFATE (2.5 MG/3ML) 0.083% IN NEBU
5.0000 mg | INHALATION_SOLUTION | Freq: Once | RESPIRATORY_TRACT | Status: AC
  Administered 2014-05-07: 5 mg via RESPIRATORY_TRACT
  Filled 2014-05-07: qty 6

## 2014-05-07 MED ORDER — SODIUM CHLORIDE 0.9 % IV SOLN
1.0000 g | Freq: Once | INTRAVENOUS | Status: AC
  Administered 2014-05-07: 1 g via INTRAVENOUS
  Filled 2014-05-07: qty 10

## 2014-05-07 MED ORDER — LORAZEPAM 0.5 MG PO TABS
0.5000 mg | ORAL_TABLET | Freq: Every day | ORAL | Status: DC | PRN
  Administered 2014-05-10: 0.5 mg via ORAL
  Filled 2014-05-07: qty 1

## 2014-05-07 MED ORDER — SODIUM BICARBONATE 8.4 % IV SOLN
INTRAVENOUS | Status: AC
  Filled 2014-05-07: qty 150

## 2014-05-07 MED ORDER — ENOXAPARIN SODIUM 30 MG/0.3ML ~~LOC~~ SOLN
30.0000 mg | SUBCUTANEOUS | Status: DC
  Administered 2014-05-07 – 2014-05-09 (×3): 30 mg via SUBCUTANEOUS
  Filled 2014-05-07 (×3): qty 0.3

## 2014-05-07 MED ORDER — SODIUM POLYSTYRENE SULFONATE 15 GM/60ML PO SUSP
15.0000 g | Freq: Once | ORAL | Status: DC
  Administered 2014-05-07: 15 g via ORAL
  Filled 2014-05-07: qty 60

## 2014-05-07 MED ORDER — SODIUM CHLORIDE 0.9 % IV SOLN
INTRAVENOUS | Status: AC
  Administered 2014-05-07: 500 mL via INTRAVENOUS

## 2014-05-07 MED ORDER — SODIUM CHLORIDE 0.9 % IV SOLN
250.0000 mL | INTRAVENOUS | Status: DC | PRN
Start: 2014-05-07 — End: 2014-05-10

## 2014-05-07 NOTE — H&P (Signed)
History and Physical  Kenneth Walker ZOX:[id number] DOB: 1955-09-15 DOA: 05/07/2014  Referring physician: Dr Jodi Mourning, ED physician PCP: Default, Provider, MD   Chief Complaint: Syncopal episode  HPI: Kenneth Walker is a 58 y.o. 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 Patient notes decreased urine output over the past couple of days with dark gold colored urine. The patient had a syncopal episode followed by hypotension with systolic blood pressure dropping to the 70s when he stood up. He was evaluated at his workplace provider.  After a period of observation, the patient was directed to the emergency department for evaluation. Patient notes nausea and some pains in his legs bilaterally that started yesterday and continued on today. His syncopal episode lasted for a few moments and was able to stand up on his own following the episode. He denies convulsions or confusion following the episode. His last urination was this morning is not been able to urinate while in the emergency department.   Review of Systems:   Pt denies any abdominal pain, chest pain, shortness of breath, wheezing, fevers, chills, vomiting, diarrhea.  Review of systems are otherwise negative  Past Medical History  Diagnosis Date  . Hypertension   . Diabetes mellitus    Past Surgical History  Procedure Laterality Date  . Ankle surgery     Social History:  reports that he has never smoked. He does not have any smokeless tobacco history on file. He reports that he does not drink alcohol or use illicit drugs. Patient lives at home & is able to participate in activities of daily living without assistance  Allergies  Allergen Reactions  . Penicillins Hives    No family history on file.   Prior to Admission medications   Medication Sig Start Date End Date Taking? Authorizing Provider  atenolol (TENORMIN) 25  MG tablet Take 12.5 mg by mouth daily.   Yes Historical Provider, MD  cholecalciferol (VITAMIN D) 1000 UNITS tablet Take 1,000 Units by mouth daily.   Yes Historical Provider, MD  lisinopril (PRINIVIL,ZESTRIL) 5 MG tablet Take 2.5 mg by mouth daily.   Yes Historical Provider, MD  LORazepam (ATIVAN) 1 MG tablet Take 0.5 mg by mouth daily as needed. For anxiety   Yes Historical Provider, MD  metFORMIN (GLUCOPHAGE) 1000 MG tablet Take 1,000 mg by mouth 2 (two) times daily with a meal.   Yes Historical Provider, MD  simvastatin (ZOCOR) 40 MG tablet Take 20 mg by mouth every evening.   Yes Historical Provider, MD    Physical Exam: BP 106/61  Pulse 83  Temp(Src) 97.6 F (36.4 C) (Oral)  Resp 17  Ht  (1.854 m)  Wt 104.327 kg (230 lb)  BMI 30.35 kg/m2  SpO2 97%  General: Middle-aged Caucasian male. Awake and alert and oriented x3. No acute cardiopulmonary distress.  Eyes: Pupils equal, round, reactive to light. Extraocular muscles are intact. Sclerae anicteric and noninjected.  ENT: External auditory canals are patent and tympanic membranes reflect a good cone of light. Dry mucosal membranes. No mucosal lesions. Teeth in poor repair  Neck: Neck supple without lymphadenopathy. No carotid bruits. No masses palpated.  Cardiovascular: Regular rate with normal S1-S2 sounds. No murmurs, rubs, gallops auscultated. No JVD.  Respiratory: Good respiratory effort with no wheezes, rales, rhonchi. Lungs clear to auscultation bilaterally.  Abdomen: Soft, nontender, nondistended. Hyperactive bowel sounds. No masses  or hepatosplenomegaly  Skin: Dry, warm to touch. 2+ dorsalis pedis and radial pulses. Musculoskeletal: Tenderness to the calves bilaterally, but without erythema or swelling. All major joints not erythematous nontender.  Psychiatric: Intact judgment and insight.  Neurologic: No focal neurological deficits. Cranial nerves II through XII are grossly intact.           Labs on Admission:    Basic Metabolic Panel:  Recent Labs Lab 05/07/14 1624  NA 140  K 7.2*  CL 97  CO2 13*  GLUCOSE 154*  BUN 74*  CREATININE 8.12*  CALCIUM 10.0   Liver Function Tests:  Recent Labs Lab 05/07/14 1624  AST 54*  ALT 33  ALKPHOS 62  BILITOT 0.4  PROT 8.3  ALBUMIN 4.3   No results found for this basename: LIPASE, AMYLASE,  in the last 168 hours No results found for this basename: AMMONIA,  in the last 168 hours CBC:  Recent Labs Lab 05/07/14 1624  WBC 14.2*  NEUTROABS 12.3*  HGB 13.3  HCT 39.7  MCV 80.9  PLT 348   Cardiac Enzymes:  Recent Labs Lab 05/07/14 1624  CKTOTAL 1555*  TROPONINI <0.30    BNP (last 3 results) No results found for this basename: PROBNP,  in the last 8760 hours CBG:  Recent Labs Lab 05/07/14 1559  GLUCAP 146*    Radiological Exams on Admission: Ct Renal Stone Study  05/07/2014   CLINICAL DATA:  58 year old male with right side low back pain. Syncope. Hypotension. Elevated creatinine. Initial encounter.  EXAM: CT RENAL STONE PROTOCOL  TECHNIQUE: Multidetector CT imaging of the abdomen and pelvis was performed following the standard protocol without intravenous contrast  COMPARISON:  10/17/2011 CT Abdomen and Pelvis  FINDINGS: No pericardial or pleural effusion.  Mild dependent atelectasis.  No acute osseous abnormality identified.  Stable small fact containing left inguinal hernia. No pelvic free fluid. Decompressed bladder. Sigmoid diverticulosis, no active inflammation identified. Occasional diverticula in the descending colon, no active inflammation. Transverse and right colon within normal limits. Normal appendix. No dilated small bowel. Distended but otherwise negative stomach. Duodenum is decompressed.  Non contrast liver, gallbladder, pancreas, and adrenal glands are within normal limits. No abdominal free fluid.  2-3 mm nephrolithiasis at the right midpole. No right hydronephrosis, perinephric stranding, or abnormality of the right  ureter.  4 mm left lower pole and punctate left midpole renal calculi. Chronic anterior midpole mildly exophytic cyst, with simple fluid densitometry. No left perinephric stranding or definite hydronephrosis, there are chronic parapelvic cysts on the left. No abnormality of the left ureter.  No lymphadenopathy identified. Mild Aortoiliac calcified atherosclerosis noted.  IMPRESSION: 1. Bilateral nephrolithiasis.  No obstructive uropathy. 2. Intermittent diverticulosis in the colon, most pronounced in the sigmoid, but no definite active inflammation. 3. Normal appendix.   Electronically Signed   By: Augusto Gamble M.D.   On: 05/07/2014 18:33    EKG: Independently reviewed. Normal sinus rhythm. No peaked T waves  Assessment/Plan Present on Admission:  . ARF (acute renal failure) . Hyperkalemia . Rhabdomyolysis . Lactic acidosis . Acute renal failure . Dehydration  #1 acute renal failure I discussed this patient with Dr. Allena Katz of nephrology, in particular the rhabdomyolysis and his level of renal failure. He advised to continue with fluid resuscitation that the patient did not need emergent dialysis now. We'll continue with IV fluids and admit to step down due to his hyperkalemia, rhabdomyolysis, acute renal failure. Will hold metformin and lisinopril.  #2 dehydration - fluid hydrate  as above, as hypotensive will hold antihypertensive medications.  #3 rhabdomyolysis   we'll start infusion of bicarbonate given that his serum bicarbonate level is 13. We'll start at 150 mL per hour and repeat BMPs every 6 hours.  #4 hyperkalemia Patient received glucose insulin the emergency department. Patient also given Kayexalate, which we will continue every 4 hours pending his potassium level. IV sodium bicarbonate should help with his potassium level  #5 lactic acidosis  #6 diabetes Sliding-scale insulin, hold metformin  DVT prophylaxis: Lovenox   Consultants: Dr. Allena Katz by phone   Code Status: Full    Family Communication: None    Disposition Plan: Pending treatment   Time spent: 70 minutes   Candelaria Celeste, DO [facility] Pager [phone]  **Disclaimer: This note may have been dictated with voice recognition software. Similar sounding words can inadvertently be transcribed and this note may contain transcription errors which may not have been corrected upon publication of note.**

## 2014-05-07 NOTE — ED Provider Notes (Signed)
CSN: [id number]     Arrival date & time 05/07/14  1548 History   First MD Initiated Contact with Patient 05/07/14 1556     Chief Complaint  Patient presents with  . Loss of Consciousness     (Consider location/radiation/quality/duration/timing/severity/associated sxs/prior Treatment) HPI Comments: 58 year old male with history of high blood pressure, diabetes presents after syncope and hypotension. 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 Glucose is 206 per report. Patient said he started to feel nauseous yesterday evening with nonspecific right-sided abdominal ache. No fevers or recent infections. No cardiac history known. No chest pain, shortness of breath or recent surgery or leg swelling or leg pain. Patient denies blood in the stools. Patient feels generally weak.  No abdominal surgery history or aneurysm history. Patient says he was trying to drink water every one to 2 hours today. Blood pressure 60/40 for EMS to standing.  Patient is a 58 y.o. male presenting with syncope. The history is provided by the patient and the EMS personnel.  Loss of Consciousness Associated symptoms: weakness   Associated symptoms: no chest pain, no fever, no headaches, no shortness of breath and no vomiting     Past Medical History  Diagnosis Date  . Hypertension   . Diabetes mellitus    Past Surgical History  Procedure Laterality Date  . Ankle surgery     No family history on file. History  Substance Use Topics  . Smoking status: Never Smoker   . Smokeless tobacco: Not on file  . Alcohol Use: No    Review of Systems  Constitutional: Positive for appetite change and fatigue. Negative for fever and chills.  HENT: Negative for congestion.   Eyes: Positive for visual disturbance (blurry bilateral).  Respiratory: Negative for shortness  of breath.   Cardiovascular: Positive for syncope. Negative for chest pain.  Gastrointestinal: Positive for abdominal pain. Negative for vomiting.  Genitourinary: Negative for dysuria and flank pain.  Musculoskeletal: Negative for back pain, neck pain and neck stiffness.  Skin: Negative for rash.  Neurological: Positive for syncope, weakness and light-headedness. Negative for headaches.      Allergies  Penicillins  Home Medications   Prior to Admission medications   Medication Sig Start Date End Date Taking? Authorizing Provider  Ascorbic Acid (VITAMIN C PO) Take 1 tablet by mouth daily.    Historical Provider, MD  atenolol (TENORMIN) 25 MG tablet Take 12.5 mg by mouth daily.    Historical Provider, MD  citalopram (CELEXA) 40 MG tablet Take 20 mg by mouth daily.    Historical Provider, MD  clopidogrel (PLAVIX) 75 MG tablet Take 75 mg by mouth daily.    Historical Provider, MD  dipyridamole-aspirin (AGGRENOX) 25-200 MG per 12 hr capsule Take 1 capsule by mouth 2 (two) times daily.    Historical Provider, MD  fish oil-omega-3 fatty acids 1000 MG capsule Take 1 g by mouth daily.    Historical Provider, MD  lisinopril (PRINIVIL,ZESTRIL) 5 MG tablet Take 2.5 mg by mouth daily.    Historical Provider, MD  LORazepam (ATIVAN) 1 MG tablet Take 0.5 mg by mouth daily as needed. For anxiety    Historical Provider, MD  metFORMIN (GLUCOPHAGE) 1000 MG tablet Take 1,000 mg by mouth 2 (two) times daily with a meal.    Historical Provider, MD  simvastatin (ZOCOR) 40 MG tablet  Take 20 mg by mouth every evening.    Historical Provider, MD  Vitamin D, Ergocalciferol, (DRISDOL) 50000 UNITS CAPS Take 50,000 Units by mouth every 7 (seven) days.    Historical Provider, MD   BP 91/47  Pulse 70  Temp(Src) 97.6 F (36.4 C) (Oral)  Resp 13  Ht  (1.854 m)  Wt 230 lb (104.327 kg)  BMI 30.35 kg/m2  SpO2 99% Physical Exam  Nursing note and vitals reviewed. Constitutional: He is oriented to person,  place, and time. He appears well-developed and well-nourished.  HENT:  Head: Normocephalic and atraumatic.  Significant dry mucous membranes  Eyes: Conjunctivae are normal. Right eye exhibits no discharge. Left eye exhibits no discharge.  Neck: Normal range of motion. Neck supple. No tracheal deviation present.  Cardiovascular: Normal rate and regular rhythm.   Pulmonary/Chest: Effort normal and breath sounds normal.  Abdominal: Soft. He exhibits no distension. There is tenderness (mild right lower corner). There is no guarding.  Musculoskeletal: He exhibits no edema.  Neurological: He is alert and oriented to person, place, and time. No cranial nerve deficit. GCS eye subscore is 3. GCS verbal subscore is 5. GCS motor subscore is 6.  Patient moves all extremities equal bilateral upper and lower extremities. Patient has subtle general weakness due to general fatigue. Sensation intact bilateral. Cranial nerves intact, pupils equal bilateral. Neck supple no meningismus  Skin: Skin is warm. No rash noted.  Psychiatric: He has a normal mood and affect.    ED Course  Procedures (including critical care time) CRITICAL CARE Performed by: Enid Skeens   Total critical care time: 60 min  Critical care time was exclusive of separately billable procedures and treating other patients.  Critical care was necessary to treat or prevent imminent or life-threatening deterioration.  Critical care was time spent personally by me on the following activities: development of treatment plan with patient and/or surrogate as well as nursing, discussions with consultants, evaluation of patient's response to treatment, examination of patient, obtaining history from patient or surrogate, ordering and performing treatments and interventions, ordering and review of laboratory studies, ordering and review of radiographic studies, pulse oximetry and re-evaluation of patient's condition.  Ultrasound limited  abdominal and limited transthoracic ultrasound (FAST)  Indication: abd pain, hypotension Four views were obtained using the low frequency transducer: Splenorenal, Hepatorenal, Retrovesical, Pericardial subxyphoid Interpretation: No free fluid visualized surrounding the kidneys, pelvis or pericardium. Images archived electronically Dr. Jodi Mourning personally performed and interpreted the images  Labs Review Labs Reviewed  CBC WITH DIFFERENTIAL - Abnormal; Notable for the following:    WBC 14.2 (*)    Neutrophils Relative % 87 (*)    Neutro Abs 12.3 (*)    Lymphocytes Relative 9 (*)    All other components within normal limits  COMPREHENSIVE METABOLIC PANEL - Abnormal; Notable for the following:    Potassium 7.2 (*)    CO2 13 (*)    Glucose, Bld 154 (*)    BUN 74 (*)    Creatinine, Ser 8.12 (*)    AST 54 (*)    GFR calc non Af Amer 6 (*)    GFR calc Af Amer 7 (*)    Anion gap 30 (*)    All other components within normal limits  CK - Abnormal; Notable for the following:    Total CK 1555 (*)    All other components within normal limits  CBG MONITORING, ED - Abnormal; Notable for the following:    Glucose-Capillary  146 (*)    All other components within normal limits  I-STAT CG4 LACTIC ACID, ED - Abnormal; Notable for the following:    Lactic Acid, Venous 2.89 (*)    All other components within normal limits  TROPONIN I  URINALYSIS, ROUTINE W REFLEX MICROSCOPIC  POTASSIUM  MAGNESIUM    Imaging Review Ct Renal Stone Study  05/07/2014   CLINICAL DATA:  58 year old male with right side low back pain. Syncope. Hypotension. Elevated creatinine. Initial encounter.  EXAM: CT RENAL STONE PROTOCOL  TECHNIQUE: Multidetector CT imaging of the abdomen and pelvis was performed following the standard protocol without intravenous contrast  COMPARISON:  10/17/2011 CT Abdomen and Pelvis  FINDINGS: No pericardial or pleural effusion.  Mild dependent atelectasis.  No acute osseous abnormality  identified.  Stable small fact containing left inguinal hernia. No pelvic free fluid. Decompressed bladder. Sigmoid diverticulosis, no active inflammation identified. Occasional diverticula in the descending colon, no active inflammation. Transverse and right colon within normal limits. Normal appendix. No dilated small bowel. Distended but otherwise negative stomach. Duodenum is decompressed.  Non contrast liver, gallbladder, pancreas, and adrenal glands are within normal limits. No abdominal free fluid.  2-3 mm nephrolithiasis at the right midpole. No right hydronephrosis, perinephric stranding, or abnormality of the right ureter.  4 mm left lower pole and punctate left midpole renal calculi. Chronic anterior midpole mildly exophytic cyst, with simple fluid densitometry. No left perinephric stranding or definite hydronephrosis, there are chronic parapelvic cysts on the left. No abnormality of the left ureter.  No lymphadenopathy identified. Mild Aortoiliac calcified atherosclerosis noted.  IMPRESSION: 1. Bilateral nephrolithiasis.  No obstructive uropathy. 2. Intermittent diverticulosis in the colon, most pronounced in the sigmoid, but no definite active inflammation. 3. Normal appendix.   Electronically Signed   By: Augusto Gamble M.D.   On: 05/07/2014 18:33     EKG Interpretation   Date/Time:  Friday May 07 2014 15:53:17 EDT Ventricular Rate:  71 PR Interval:  162 QRS Duration: 109 QT Interval:  399 QTC Calculation: 434 R Axis:   57 Text Interpretation:  Sinus rhythm Confirmed by Martita Brumm  MD, Brittan Mapel ([id number])  on 05/07/2014 4:43:19 PM      MDM   Final diagnoses:  Hypotension, unspecified hypotension type  Acute renal failure, unspecified acute renal failure type  Dehydration  Hyperkalemia  Syncope and collapse  Diverticulosis of intestine without bleeding, unspecified intestinal tract location   Clinically patient presented hypotensive likely due to heat exhaustion/dehydration from working  in a hot trailer all day. Patient has had nonspecific right-sided abdominal pain and nausea since last night. Bedside ultrasound fast revealed no free fluid however did reveal cystic structure left kidney for which patient does not recall ever being told. No known cyst or renal cancer history. With hypotension, vague abdominal pain plan for CT angina for further detail to rule out AAA. 2 peripheral IVs and 2 L IV fluid bolus.  Patient with severe acute renal failure and hyperkalemia. Hyperkalemia treatment given. No EKG changes seen. Patient gradually improved and multiple rechecks with IV fluids. Unable to do CT with contrast to 2 significant kidney dysfunction. Low pretest probability for dissection or pulmonary embolism. CT without contrast ordered in no acute findings, diverticulosis seen.  4 L IV fluids in ED. BP improved from 70s to 110. Discuss case with [facility] for stepped-down admission. The patients results and plan were reviewed and discussed.   Any x-rays performed were personally reviewed by myself.   Differential diagnosis were  considered with the presenting HPI.  Medications  0.9 %  sodium chloride infusion (not administered)  ondansetron (ZOFRAN) injection 4 mg (not administered)  sodium chloride 0.9 % bolus 1,000 mL (0 mLs Intravenous Stopped 05/07/14 1738)  sodium chloride 0.9 % bolus 1,000 mL (0 mLs Intravenous Stopped 05/07/14 1638)  sodium chloride 0.9 % bolus 1,000 mL (0 mLs Intravenous Stopped 05/07/14 1837)  calcium gluconate 1 g in sodium chloride 0.9 % 100 mL IVPB (1 g Intravenous New Bag/Given 05/07/14 1833)  insulin aspart (novoLOG) injection 10 Units (10 Units Intravenous Given 05/07/14 1841)  dextrose 50 % solution 50 mL (50 mLs Intravenous Given 05/07/14 1842)  0.9 %  sodium chloride infusion ( Intravenous New Bag/Given 05/07/14 1838)  albuterol (PROVENTIL) (2.5 MG/3ML) 0.083% nebulizer solution 5 mg (5 mg Nebulization Given 05/07/14 1823)  sodium chloride 0.9  % bolus 1,000 mL (1,000 mLs Intravenous New Bag/Given 05/07/14 1849)      Filed Vitals:   05/07/14 1651 05/07/14 1748 05/07/14 1825 05/07/14 1846  BP: 90/46 110/59  103/53  Pulse: 70 71  81  Temp:      TempSrc:      Resp: Height:      Weight:      SpO2: 98% 99% 99% 99%    Admission/ observation were discussed with the admitting physician, patient and/or family and they are comfortable with the plan.    Enid Skeens, MD 05/07/14 [phone]

## 2014-05-07 NOTE — ED Notes (Signed)
Pt states he vomited last night also. EDP performed bedside US to determine emergent causes of symptoms.

## 2014-05-07 NOTE — ED Notes (Signed)
Pt passed out at work. EMS states initial blood pressures of 80/60 (sitting) and 60/40 (standing) on scene. HR has been irregular also, per EMS. Pt was also medicated with nausea en route. Pt works in a [employer]. CBG en route was 206

## 2014-05-08 DIAGNOSIS — E119 Type 2 diabetes mellitus without complications: Secondary | ICD-10-CM

## 2014-05-08 DIAGNOSIS — E669 Obesity, unspecified: Secondary | ICD-10-CM

## 2014-05-08 LAB — URINALYSIS, ROUTINE W REFLEX MICROSCOPIC
BILIRUBIN URINE: NEGATIVE
GLUCOSE, UA: NEGATIVE mg/dL
Ketones, ur: NEGATIVE mg/dL
Leukocytes, UA: NEGATIVE
Nitrite: NEGATIVE
PROTEIN: 30 mg/dL — AB
Specific Gravity, Urine: 1.02 (ref 1.005–1.030)
UROBILINOGEN UA: 0.2 mg/dL (ref 0.0–1.0)
pH: 5.5 (ref 5.0–8.0)

## 2014-05-08 LAB — BASIC METABOLIC PANEL
Anion gap: 23 — ABNORMAL HIGH (ref 5–15)
Anion gap: 23 — ABNORMAL HIGH (ref 5–15)
Anion gap: 21 — ABNORMAL HIGH (ref 5–15)
BUN: 74 mg/dL — ABNORMAL HIGH (ref 6–23)
BUN: 76 mg/dL — AB (ref 6–23)
BUN: 77 mg/dL — AB (ref 6–23)
CALCIUM: 8.1 mg/dL — AB (ref 8.4–10.5)
CHLORIDE: 103 meq/L (ref 96–112)
CHLORIDE: 104 meq/L (ref 96–112)
CO2: 16 meq/L — AB (ref 19–32)
CO2: 18 meq/L — AB (ref 19–32)
CO2: 19 meq/L (ref 19–32)
CREATININE: 6.47 mg/dL — AB (ref 0.50–1.35)
Calcium: 8.6 mg/dL (ref 8.4–10.5)
Calcium: 8.5 mg/dL (ref 8.4–10.5)
Chloride: 101 mEq/L (ref 96–112)
Creatinine, Ser: 7.67 mg/dL — ABNORMAL HIGH (ref 0.50–1.35)
Creatinine, Ser: 7.52 mg/dL — ABNORMAL HIGH (ref 0.50–1.35)
GFR calc Af Amer: 8 mL/min — ABNORMAL LOW (ref >90)
GFR calc Af Amer: 10 mL/min — ABNORMAL LOW (ref >90)
GFR calc non Af Amer: 7 mL/min — ABNORMAL LOW (ref >90)
GFR calc non Af Amer: 7 mL/min — ABNORMAL LOW (ref >90)
GFR calc non Af Amer: 8 mL/min — ABNORMAL LOW (ref >90)
GFR, EST AFRICAN AMERICAN: 8 mL/min — AB (ref >90)
GLUCOSE: 124 mg/dL — AB (ref 70–99)
GLUCOSE: 187 mg/dL — AB (ref 70–99)
GLUCOSE: 75 mg/dL (ref 70–99)
POTASSIUM: 4.2 meq/L (ref 3.7–5.3)
Potassium: 5.7 mEq/L — ABNORMAL HIGH (ref 3.7–5.3)
Potassium: 3.6 mEq/L — ABNORMAL LOW (ref 3.7–5.3)
Sodium: 142 mEq/L (ref 137–147)
Sodium: 145 mEq/L (ref 137–147)
Sodium: 141 mEq/L (ref 137–147)

## 2014-05-08 LAB — CBC
HCT: 33.8 % — ABNORMAL LOW (ref 39.0–52.0)
HEMOGLOBIN: 11.3 g/dL — AB (ref 13.0–17.0)
MCH: 27.1 pg (ref 26.0–34.0)
MCHC: 33.4 g/dL (ref 30.0–36.0)
MCV: 81.1 fL (ref 78.0–100.0)
Platelets: 284 10*3/uL (ref 150–400)
RBC: 4.17 MIL/uL — ABNORMAL LOW (ref 4.22–5.81)
RDW: 15.4 % (ref 11.5–15.5)
WBC: 11 10*3/uL — AB (ref 4.0–10.5)

## 2014-05-08 LAB — URINE MICROSCOPIC-ADD ON

## 2014-05-08 LAB — GLUCOSE, CAPILLARY
GLUCOSE-CAPILLARY: 114 mg/dL — AB (ref 70–99)
GLUCOSE-CAPILLARY: 161 mg/dL — AB (ref 70–99)
GLUCOSE-CAPILLARY: 173 mg/dL — AB (ref 70–99)
Glucose-Capillary: 74 mg/dL (ref 70–99)
Glucose-Capillary: 218 mg/dL — ABNORMAL HIGH (ref 70–99)

## 2014-05-08 MED ORDER — DEXTROSE 5 % IV SOLN
100.0000 mg | Freq: Two times a day (BID) | INTRAVENOUS | Status: DC
  Administered 2014-05-08 (×2): 100 mg via INTRAVENOUS
  Filled 2014-05-08 (×5): qty 10

## 2014-05-08 NOTE — Plan of Care (Signed)
Problem: Consults Goal: ARF/New Onset CRF Patient Education See Patient Education Module for education specifics. Outcome: Progressing New onset for patient, dehydration, syncopol episode  Problem: Phase I Progression Outcomes Goal: Dyspnea controlled at rest Outcome: Progressing Mild dyspnea noted on 4 liters, Goal: If HF, initiate HF Core Reminder Form Outcome: Progressing Sat with patient and discussed with him in layman's terms his status at this present admission. Goal: Pain controlled with appropriate interventions Outcome: Progressing Abdominal pain on admission Goal: OOB as tolerated unless otherwise ordered Outcome: Progressing oob to bsc with minimal assist Goal: Initial discharge plan identified Outcome: Progressing To home

## 2014-05-08 NOTE — Consult Note (Signed)
Reason for Consult: Acute kidney injury Referring Physician: Dr. Burman [name] is an 58 y.o. male.  HPI: Patient with history of diabetes for about 10 years, hypertension, presently came with complaints of in his urine output and dark brown urine for the last couple of days. 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 When he was evaluated patient was found to have hypotension hence came to the hospital. Patient also says that he has some back pain and also pain of his legs for the last 3 to 4 days. Patient denies any previous history of renal failure however he has history of kidney stone. Patient has this moment has episode of nausea but no vomiting and he denies any diarrhea. Patient states that he was working in hot weather but he was drinking loss of water. Presently patient came with acute renal failure. He states that he'[name] feeling much better.  Past Medical History  Diagnosis Date  . Hypertension   . Diabetes mellitus     Past Surgical History  Procedure Laterality Date  . Ankle surgery      History reviewed. No pertinent family history.  Social History:  reports that he has never smoked. He does not have any smokeless tobacco history on file. He reports that he does not drink alcohol or use illicit drugs.  Allergies:  Allergies  Allergen Reactions  . Penicillins Hives    Medications: I have reviewed the patient'[name] current medications.  Results for orders placed during the hospital encounter of 05/07/14 (from the past 48 hour([name]))  CBG MONITORING, ED     Status: Abnormal   Collection Time    05/07/14  3:59 PM      Result Value Ref Range   Glucose-Capillary 146 (*) 70 - 99 mg/dL  TROPONIN I     Status: None   Collection Time    05/07/14  4:24 PM      Result Value Ref Range   Troponin I <0.30  <0.30 ng/mL   Comment:            Due to the release kinetics of cTnI,     a negative result within the first hours      of the onset of symptoms does not rule out     myocardial infarction with certainty.     If myocardial infarction is still suspected,     repeat the test at appropriate intervals.  CBC WITH DIFFERENTIAL     Status: Abnormal   Collection Time    05/07/14  4:24 PM      Result Value Ref Range   WBC 14.2 (*) 4.0 - 10.5 K/uL   RBC 4.91  4.22 - 5.81 MIL/uL   Hemoglobin 13.3  13.0 - 17.0 g/dL   HCT 39.7  39.0 - 52.0 %   MCV 80.9  78.0 - 100.0 fL   MCH 27.1  26.0 - 34.0 pg   MCHC 33.5  30.0 - 36.0 g/dL   RDW 14.9  11.5 - 15.5 %   Platelets 348  150 - 400 K/uL   Neutrophils Relative % 87 (*) 43 - 77 %   Neutro Abs 12.3 (*) 1.7 - 7.7 K/uL   Lymphocytes Relative 9 (*) 12 - 46 %   Lymphs Abs 1.3  0.7 - 4.0 K/uL   Monocytes Relative 4  3 - 12 %   Monocytes Absolute 0.6  0.1 - 1.0 K/uL   Eosinophils Relative 0  0 - 5 %   Eosinophils Absolute 0.0  0.0 - 0.7 K/uL   Basophils Relative 0  0 - 1 %   Basophils Absolute 0.0  0.0 - 0.1 K/uL  COMPREHENSIVE METABOLIC PANEL     Status: Abnormal   Collection Time    05/07/14  4:24 PM      Result Value Ref Range   Sodium 140  137 - 147 mEq/L   Potassium 7.2 (*) 3.7 - 5.3 mEq/L   Comment: CRITICAL RESULT CALLED TO, READ BACK BY AND VERIFIED WITH:     KEITH,J AT 1753 BY GODFREY,O ON 05/07/14   Chloride 97  96 - 112 mEq/L   CO2 13 (*) 19 - 32 mEq/L   Glucose, Bld 154 (*) 70 - 99 mg/dL   BUN 74 (*) 6 - 23 mg/dL   Creatinine, Ser 8.12 (*) 0.50 - 1.35 mg/dL   Calcium 10.0  8.4 - 10.5 mg/dL   Total Protein 8.3  6.0 - 8.3 g/dL   Albumin 4.3  3.5 - 5.2 g/dL   AST 54 (*) 0 - 37 U/L   ALT 33  0 - 53 U/L   Alkaline Phosphatase 62  39 - 117 U/L   Total Bilirubin 0.4  0.3 - 1.2 mg/dL   GFR calc non Af Amer 6 (*) >90 mL/min   GFR calc Af Amer 7 (*) >90 mL/min   Comment: (NOTE)     The eGFR has been calculated using the CKD EPI equation.     This calculation has not been validated in all clinical situations.     eGFR'[name] persistently <90 mL/min signify  possible Chronic Kidney     Disease.   Anion gap 30 (*) 5 - 15  CK     Status: Abnormal   Collection Time    05/07/14  4:24 PM      Result Value Ref Range   Total CK 1555 (*) 7 - 232 U/L  I-STAT CG4 LACTIC ACID, ED     Status: Abnormal   Collection Time    05/07/14  4:40 PM      Result Value Ref Range   Lactic Acid, Venous 2.89 (*) 0.5 - 2.2 mmol/L  POTASSIUM     Status: None   Collection Time    05/07/14  7:40 PM      Result Value Ref Range   Potassium 5.3  3.7 - 5.3 mEq/L   Comment: DELTA CHECK NOTED  MAGNESIUM     Status: None   Collection Time    05/07/14  7:40 PM      Result Value Ref Range   Magnesium 2.0  1.5 - 2.5 mg/dL  GLUCOSE, CAPILLARY     Status: Abnormal   Collection Time    05/07/14  9:26 PM      Result Value Ref Range   Glucose-Capillary 55 (*) 70 - 99 mg/dL   Comment 1 Notify RN     Comment 2 Documented in Chart    MRSA PCR SCREENING     Status: None   Collection Time    05/07/14  9:27 PM      Result Value Ref Range   MRSA by PCR NEGATIVE  NEGATIVE   Comment:            The GeneXpert MRSA Assay (FDA     approved for NASAL specimens     only), is one component of a     comprehensive MRSA colonization  surveillance program. It is not     intended to diagnose MRSA     infection nor to guide or     monitor treatment for     MRSA infections.  GLUCOSE, CAPILLARY     Status: None   Collection Time    05/07/14  9:49 PM      Result Value Ref Range   Glucose-Capillary 70  70 - 99 mg/dL   Comment 1 Notify RN     Comment 2 Documented in Chart    GLUCOSE, CAPILLARY     Status: None   Collection Time    05/07/14 10:34 PM      Result Value Ref Range   Glucose-Capillary 91  70 - 99 mg/dL   Comment 1 Documented in Chart     Comment 2 Notify RN    GLUCOSE, CAPILLARY     Status: Abnormal   Collection Time    05/08/14 12:38 AM      Result Value Ref Range   Glucose-Capillary 114 (*) 70 - 99 mg/dL   Comment 1 Notify RN     Comment 2 Documented in Chart     BASIC METABOLIC PANEL     Status: Abnormal   Collection Time    05/08/14 12:56 AM      Result Value Ref Range   Sodium 142  137 - 147 mEq/L   Potassium 5.7 (*) 3.7 - 5.3 mEq/L   Chloride 103  96 - 112 mEq/L   CO2 16 (*) 19 - 32 mEq/L   Glucose, Bld 124 (*) 70 - 99 mg/dL   BUN 77 (*) 6 - 23 mg/dL   Creatinine, Ser 7.67 (*) 0.50 - 1.35 mg/dL   Calcium 8.6  8.4 - 10.5 mg/dL   GFR calc non Af Amer 7 (*) >90 mL/min   GFR calc Af Amer 8 (*) >90 mL/min   Comment: (NOTE)     The eGFR has been calculated using the CKD EPI equation.     This calculation has not been validated in all clinical situations.     eGFR'[name] persistently <90 mL/min signify possible Chronic Kidney     Disease.   Anion gap 23 (*) 5 - 15  URINALYSIS, ROUTINE W REFLEX MICROSCOPIC     Status: Abnormal   Collection Time    05/08/14  1:14 AM      Result Value Ref Range   Color, Urine YELLOW  YELLOW   APPearance CLEAR  CLEAR   Specific Gravity, Urine 1.020  1.005 - 1.030   pH 5.5  5.0 - 8.0   Glucose, UA NEGATIVE  NEGATIVE mg/dL   Hgb urine dipstick LARGE (*) NEGATIVE   Bilirubin Urine NEGATIVE  NEGATIVE   Ketones, ur NEGATIVE  NEGATIVE mg/dL   Protein, ur 30 (*) NEGATIVE mg/dL   Urobilinogen, UA 0.2  0.0 - 1.0 mg/dL   Nitrite NEGATIVE  NEGATIVE   Leukocytes, UA NEGATIVE  NEGATIVE  URINE MICROSCOPIC-ADD ON     Status: None   Collection Time    05/08/14  1:14 AM      Result Value Ref Range   Squamous Epithelial / LPF RARE  RARE   WBC, UA 0-2  <3 WBC/hpf   RBC / HPF 3-6  <3 RBC/hpf   Bacteria, UA RARE  RARE  CBC     Status: Abnormal   Collection Time    05/08/14  5:48 AM      Result Value Ref Range   WBC  11.0 (*) 4.0 - 10.5 K/uL   RBC 4.17 (*) 4.22 - 5.81 MIL/uL   Hemoglobin 11.3 (*) 13.0 - 17.0 g/dL   HCT 33.8 (*) 39.0 - 52.0 %   MCV 81.1  78.0 - 100.0 fL   MCH 27.1  26.0 - 34.0 pg   MCHC 33.4  30.0 - 36.0 g/dL   RDW 15.4  11.5 - 15.5 %   Platelets 284  150 - 400 K/uL  BASIC METABOLIC PANEL     Status:  Abnormal   Collection Time    05/08/14  5:48 AM      Result Value Ref Range   Sodium 145  137 - 147 mEq/L   Potassium 4.2  3.7 - 5.3 mEq/L   Comment: DELTA CHECK NOTED   Chloride 104  96 - 112 mEq/L   CO2 18 (*) 19 - 32 mEq/L   Glucose, Bld 75  70 - 99 mg/dL   BUN 76 (*) 6 - 23 mg/dL   Creatinine, Ser 7.52 (*) 0.50 - 1.35 mg/dL   Calcium 8.5  8.4 - 10.5 mg/dL   GFR calc non Af Amer 7 (*) >90 mL/min   GFR calc Af Amer 8 (*) >90 mL/min   Comment: (NOTE)     The eGFR has been calculated using the CKD EPI equation.     This calculation has not been validated in all clinical situations.     eGFR'[name] persistently <90 mL/min signify possible Chronic Kidney     Disease.   Anion gap 23 (*) 5 - 15  GLUCOSE, CAPILLARY     Status: None   Collection Time    05/08/14  7:30 AM      Result Value Ref Range   Glucose-Capillary 74  70 - 99 mg/dL   Comment 1 Documented in Chart     Comment 2 Notify RN      Ct Renal Stone Study  05/07/2014   CLINICAL DATA:  58 year old male with right side low back pain. Syncope. Hypotension. Elevated creatinine. Initial encounter.  EXAM: CT RENAL STONE PROTOCOL  TECHNIQUE: Multidetector CT imaging of the abdomen and pelvis was performed following the standard protocol without intravenous contrast  COMPARISON:  10/17/2011 CT Abdomen and Pelvis  FINDINGS: No pericardial or pleural effusion.  Mild dependent atelectasis.  No acute osseous abnormality identified.  Stable small fact containing left inguinal hernia. No pelvic free fluid. Decompressed bladder. Sigmoid diverticulosis, no active inflammation identified. Occasional diverticula in the descending colon, no active inflammation. Transverse and right colon within normal limits. Normal appendix. No dilated small bowel. Distended but otherwise negative stomach. Duodenum is decompressed.  Non contrast liver, gallbladder, pancreas, and adrenal glands are within normal limits. No abdominal free fluid.  2-3 mm nephrolithiasis at  the right midpole. No right hydronephrosis, perinephric stranding, or abnormality of the right ureter.  4 mm left lower pole and punctate left midpole renal calculi. Chronic anterior midpole mildly exophytic cyst, with simple fluid densitometry. No left perinephric stranding or definite hydronephrosis, there are chronic parapelvic cysts on the left. No abnormality of the left ureter.  No lymphadenopathy identified. Mild Aortoiliac calcified atherosclerosis noted.  IMPRESSION: 1. Bilateral nephrolithiasis.  No obstructive uropathy. 2. Intermittent diverticulosis in the colon, most pronounced in the sigmoid, but no definite active inflammation. 3. Normal appendix.   Electronically Signed   By: Lars Pinks M.D.   On: 05/07/2014 18:33    Review of Systems  Constitutional: Positive for malaise/fatigue. Negative for fever and chills.  Respiratory: Negative for cough, hemoptysis and sputum production.   Cardiovascular: Negative for leg swelling.  Gastrointestinal: Positive for nausea. Negative for heartburn, vomiting, abdominal pain and diarrhea.  Genitourinary: Negative for frequency and hematuria.       Decreased urine output the last couple of days  Musculoskeletal: Positive for back pain and myalgias.  Neurological: Positive for dizziness and weakness. Negative for tingling, tremors and speech change.   Blood pressure 118/66, pulse 61, temperature 98 F (36.7 C), temperature source Oral, resp. rate 17, height 6' 1" (1.854 m), weight 112.2 kg (247 lb 5.7 oz), SpO2 98.00%. Physical Exam  Constitutional: No distress.  Eyes: No scleral icterus.  Neck: No JVD present.  Cardiovascular: Normal rate and regular rhythm.   No murmur heard. Respiratory: He has no wheezes. He has no rales.  GI: There is no tenderness. There is no rebound.  Musculoskeletal: He exhibits no edema.    Assessment/Plan: Problem #1 acute kidney injury: Possibly a combination of prerenal syndrome and rhabdomyolysis. Patient with  elevated CPK and hematuria without significant red blood cells. Since patient was also on ACE inhibitor possibly that might contribute to the problem. Presently his renal function seems to be improving. Problem #2 hyperkalemia: Possibly part of  rhabdomyolysis and also ACE inhibitor. Problem #3 metabolic acidosis: Presently is on sodium bicarbonate he CO2 has improved. Patient with anion gap metabolic acidosis. He is also some elevation of his lactic acid. This could with secondary to his renal failure and also metformin. Problem #4 bilateral kidney stone: Presently he doesn't have any obstructive uropathy. Problem #5 hypertension: His blood pressure is reasonably controlled Problem #6 history of diabetes Problem #7 anemia Plan: Agree with hydration and will increase his IV fluid to 145 cc per hour Will start patient on Lasix 100 mg IV twice a day We'll check his basic metabolic panel, CPK in the morning. We'll check myoglobin in the urine.    Kenneth [name] 05/08/2014, 8:52 AM

## 2014-05-08 NOTE — Progress Notes (Signed)
[facility] PROGRESS NOTE  Kenneth [name] WGN:[id number] DOB: July 02, 1956 DOA: 05/07/2014 PCP: Default, Provider, MD  Assessment/Plan: 1. Acute renal failure. Likely multifactorial from rhabdomyolysis, volume depletion, ACE inhibitor. He then started on a bicarbonate infusion. Nephrology is following. He is continued on IV fluids and Lasix has been added. We'll continue to monitor urine output. Continue to hold lisinopril. 2. Dehydration. Patient was initially hypotensive. Antihypertensive medications are hold. Continue IV hydration. 3. Rhabdomyolysis. CK level was only mildly elevated. We'll recheck and continue IV fluids. 4. Hyperkalemia. Related to renal failure. Improved with Kayexalate. 5. Lactic acidosis. Likely related to renal failure and concomitant metformin use. Performance currently on hold. Continue IV hydration. 6. Diabetes. Sliding scale insulin. Hold metformin  Code Status: full code Family Communication: discussed with patient Disposition Plan: discharge home once improved   Consultants:  Nephrology  Procedures:    Antibiotics:    HPI/Subjective: Still feels nauseous, no shortness of breath  Objective: Filed Vitals:   05/08/14 0804  BP:   Pulse:   Temp: 98 F (36.7 C)  Resp:     Intake/Output Summary (Last 24 hours) at 05/08/14 1129 Last data filed at 05/08/14 0800  Gross per 24 hour  Intake 1965.42 ml  Output    450 ml  Net 1515.42 ml   Filed Weights   05/07/14 1554 05/07/14 2138 05/08/14 0500  Weight: 104.327 kg (230 lb) 108.3 kg (238 lb 12.1 oz) 112.2 kg (247 lb 5.7 oz)    Exam:   General:  NAD  Cardiovascular: S1, S2 RRR  Respiratory: CTA B  Abdomen: soft, nt, nd, bs+  Musculoskeletal: no edema b/l   Data Reviewed: Basic Metabolic Panel:  Recent Labs Lab 05/07/14 1624 05/07/14 1940 05/08/14 0056 05/08/14 0548  NA 140  --  142 145  K 7.2* 5.3 5.7* 4.2  CL 97  --  103 104  CO2 13*  --  16* 18*  GLUCOSE 154*  --   124* 75  BUN 74*  --  77* 76*  CREATININE 8.12*  --  7.67* 7.52*  CALCIUM 10.0  --  8.6 8.5  MG  --  2.0  --   --    Liver Function Tests:  Recent Labs Lab 05/07/14 1624  AST 54*  ALT 33  ALKPHOS 62  BILITOT 0.4  PROT 8.3  ALBUMIN 4.3   No results found for this basename: LIPASE, AMYLASE,  in the last 168 hours No results found for this basename: AMMONIA,  in the last 168 hours CBC:  Recent Labs Lab 05/07/14 1624 05/08/14 0548  WBC 14.2* 11.0*  NEUTROABS 12.3*  --   HGB 13.3 11.3*  HCT 39.7 33.8*  MCV 80.9 81.1  PLT 348 284   Cardiac Enzymes:  Recent Labs Lab 05/07/14 1624  CKTOTAL 1555*  TROPONINI <0.30   BNP (last 3 results) No results found for this basename: PROBNP,  in the last 8760 hours CBG:  Recent Labs Lab 05/07/14 2126 05/07/14 2149 05/07/14 2234 05/08/14 0038 05/08/14 0730  GLUCAP 55* 70 91 114* 74    Recent Results (from the past 240 hour(s))  MRSA PCR SCREENING     Status: None   Collection Time    05/07/14  9:27 PM      Result Value Ref Range Status   MRSA by PCR NEGATIVE  NEGATIVE Final   Comment:            The GeneXpert MRSA Assay (FDA     approved for  NASAL specimens     only), is one component of a     comprehensive MRSA colonization     surveillance program. It is not     intended to diagnose MRSA     infection nor to guide or     monitor treatment for     MRSA infections.     Studies: Ct Renal Stone Study  05/07/2014   CLINICAL DATA:  58 year old male with right side low back pain. Syncope. Hypotension. Elevated creatinine. Initial encounter.  EXAM: CT RENAL STONE PROTOCOL  TECHNIQUE: Multidetector CT imaging of the abdomen and pelvis was performed following the standard protocol without intravenous contrast  COMPARISON:  10/17/2011 CT Abdomen and Pelvis  FINDINGS: No pericardial or pleural effusion.  Mild dependent atelectasis.  No acute osseous abnormality identified.  Stable small fact containing left inguinal hernia.  No pelvic free fluid. Decompressed bladder. Sigmoid diverticulosis, no active inflammation identified. Occasional diverticula in the descending colon, no active inflammation. Transverse and right colon within normal limits. Normal appendix. No dilated small bowel. Distended but otherwise negative stomach. Duodenum is decompressed.  Non contrast liver, gallbladder, pancreas, and adrenal glands are within normal limits. No abdominal free fluid.  2-3 mm nephrolithiasis at the right midpole. No right hydronephrosis, perinephric stranding, or abnormality of the right ureter.  4 mm left lower pole and punctate left midpole renal calculi. Chronic anterior midpole mildly exophytic cyst, with simple fluid densitometry. No left perinephric stranding or definite hydronephrosis, there are chronic parapelvic cysts on the left. No abnormality of the left ureter.  No lymphadenopathy identified. Mild Aortoiliac calcified atherosclerosis noted.  IMPRESSION: 1. Bilateral nephrolithiasis.  No obstructive uropathy. 2. Intermittent diverticulosis in the colon, most pronounced in the sigmoid, but no definite active inflammation. 3. Normal appendix.   Electronically Signed   By: Augusto Gamble M.D.   On: 05/07/2014 18:33    Scheduled Meds: . enoxaparin (LOVENOX) injection  30 mg Subcutaneous Q24H  . furosemide  100 mg Intravenous BID  . insulin aspart  0-15 Units Subcutaneous TID WC  . sodium polystyrene  15 g Oral Q4H   Continuous Infusions: .  sodium bicarbonate 150 mEq in sterile water 1000 mL infusion 145 mL/hr at 05/08/14 1610    Active Problems:   ARF (acute renal failure)   Hyperkalemia   Rhabdomyolysis   Lactic acidosis   Acute renal failure   Dehydration    Time spent:    [facility]  [facility] Pager [phone]. If 7PM-7AM, please contact night-coverage at [email or web address], password [facility] 05/08/2014, 11:29 AM  LOS: 1 day

## 2014-05-09 LAB — BASIC METABOLIC PANEL
ANION GAP: 18 — AB (ref 5–15)
BUN: 61 mg/dL — ABNORMAL HIGH (ref 6–23)
CALCIUM: 8.1 mg/dL — AB (ref 8.4–10.5)
CO2: 30 mEq/L (ref 19–32)
CREATININE: 3.54 mg/dL — AB (ref 0.50–1.35)
Chloride: 96 mEq/L (ref 96–112)
GFR calc non Af Amer: 17 mL/min — ABNORMAL LOW (ref >90)
GFR, EST AFRICAN AMERICAN: 20 mL/min — AB (ref >90)
Glucose, Bld: 119 mg/dL — ABNORMAL HIGH (ref 70–99)
Potassium: 3.1 mEq/L — ABNORMAL LOW (ref 3.7–5.3)
Sodium: 144 mEq/L (ref 137–147)

## 2014-05-09 LAB — CBC
HCT: 32.3 % — ABNORMAL LOW (ref 39.0–52.0)
Hemoglobin: 10.9 g/dL — ABNORMAL LOW (ref 13.0–17.0)
MCH: 26.8 pg (ref 26.0–34.0)
MCHC: 33.7 g/dL (ref 30.0–36.0)
MCV: 79.4 fL (ref 78.0–100.0)
PLATELETS: 257 10*3/uL (ref 150–400)
RBC: 4.07 MIL/uL — ABNORMAL LOW (ref 4.22–5.81)
RDW: 15 % (ref 11.5–15.5)
WBC: 7.6 10*3/uL (ref 4.0–10.5)

## 2014-05-09 LAB — GLUCOSE, CAPILLARY
GLUCOSE-CAPILLARY: 130 mg/dL — AB (ref 70–99)
GLUCOSE-CAPILLARY: 209 mg/dL — AB (ref 70–99)
GLUCOSE-CAPILLARY: 156 mg/dL — AB (ref 70–99)
Glucose-Capillary: 222 mg/dL — ABNORMAL HIGH (ref 70–99)

## 2014-05-09 LAB — CK: CK TOTAL: 405 U/L — AB (ref 7–232)

## 2014-05-09 MED ORDER — POTASSIUM CHLORIDE CRYS ER 20 MEQ PO TBCR
40.0000 meq | EXTENDED_RELEASE_TABLET | Freq: Once | ORAL | Status: AC
  Administered 2014-05-09: 40 meq via ORAL
  Filled 2014-05-09: qty 2

## 2014-05-09 MED ORDER — POTASSIUM CHLORIDE IN NACL 20-0.45 MEQ/L-% IV SOLN
INTRAVENOUS | Status: DC
  Administered 2014-05-09: 1000 mL via INTRAVENOUS
  Administered 2014-05-09: 12:00:00 via INTRAVENOUS
  Administered 2014-05-10: 1000 mL via INTRAVENOUS
  Filled 2014-05-09 (×4): qty 1000

## 2014-05-09 NOTE — Progress Notes (Signed)
Subjective: Interval History: has no complaint of dizziness or lightheadedness. Patient also denies any muscle pain. Presently he offers no complaints..  Objective: Vital signs in last 24 hours: Temp:  [97.9 F (36.6 C)-98.8 F (37.1 C)] 98.2 F (36.8 C) (08/30 0813) Pulse Rate:  [46-71] 50 (08/30 0630) Resp:  [10-26] 12 (08/30 0630) BP: (95-129)/(38-101) 100/57 mmHg (08/30 0630) SpO2:  [90 %-100 %] 97 % (08/30 0630) Weight:  [110 kg (242 lb 8.1 oz)] 110 kg (242 lb 8.1 oz) (08/30 0500) Weight change: 5.673 kg (12 lb 8.1 oz)  Intake/Output from previous day: 08/29 0701 - 08/30 0700 In: 4480.4 [P.O.:720; I.V.:3640.4; IV Piggyback:120] Out: 5700 [Urine:5700] Intake/Output this shift:    General appearance: alert, cooperative and no distress Resp: clear to auscultation bilaterally Cardio: regular rate and rhythm, S1, S2 normal, no murmur, click, rub or gallop Extremities: extremities normal, atraumatic, no cyanosis or edema  Lab Results:  Recent Labs  05/08/14 0548 05/09/14 0528  WBC 11.0* 7.6  HGB 11.3* 10.9*  HCT 33.8* 32.3*  PLT 284 257   BMET:  Recent Labs  05/08/14 1232 05/09/14 0528  NA 141 144  K 3.6* 3.1*  CL 101 96  CO2 19 30  GLUCOSE 187* 119*  BUN 74* 61*  CREATININE 6.47* 3.54*  CALCIUM 8.1* 8.1*   No results found for this basename: PTH,  in the last 72 hours Iron Studies: No results found for this basename: IRON, TIBC, TRANSFERRIN, FERRITIN,  in the last 72 hours  Studies/Results: Ct Renal Stone Study  05/07/2014   CLINICAL DATA:  58 year old male with right side low back pain. Syncope. Hypotension. Elevated creatinine. Initial encounter.  EXAM: CT RENAL STONE PROTOCOL  TECHNIQUE: Multidetector CT imaging of the abdomen and pelvis was performed following the standard protocol without intravenous contrast  COMPARISON:  10/17/2011 CT Abdomen and Pelvis  FINDINGS: No pericardial or pleural effusion.  Mild dependent atelectasis.  No acute osseous  abnormality identified.  Stable small fact containing left inguinal hernia. No pelvic free fluid. Decompressed bladder. Sigmoid diverticulosis, no active inflammation identified. Occasional diverticula in the descending colon, no active inflammation. Transverse and right colon within normal limits. Normal appendix. No dilated small bowel. Distended but otherwise negative stomach. Duodenum is decompressed.  Non contrast liver, gallbladder, pancreas, and adrenal glands are within normal limits. No abdominal free fluid.  2-3 mm nephrolithiasis at the right midpole. No right hydronephrosis, perinephric stranding, or abnormality of the right ureter.  4 mm left lower pole and punctate left midpole renal calculi. Chronic anterior midpole mildly exophytic cyst, with simple fluid densitometry. No left perinephric stranding or definite hydronephrosis, there are chronic parapelvic cysts on the left. No abnormality of the left ureter.  No lymphadenopathy identified. Mild Aortoiliac calcified atherosclerosis noted.  IMPRESSION: 1. Bilateral nephrolithiasis.  No obstructive uropathy. 2. Intermittent diverticulosis in the colon, most pronounced in the sigmoid, but no definite active inflammation. 3. Normal appendix.   Electronically Signed   By: Augusto Gamble M.D.   On: 05/07/2014 18:33    I have reviewed the patient's current medications.  Assessment/Plan: Problem #1 acute kidney injury: His BUN and creatinine has improved significantly. The etiology for his renal failure was thought to be secondary to prerenal syndrome versus rhabdomyolysis. His CPK also has improved. Presently patient is none oliguric. Problem #2 metabolic acidosis: He is on sodium bicarbonate and he CO2 has normalized. Problem #3 hypokalemia: His potassium has declined most likely from diuretics. Problem #4 hypertension: His blood pressure is  reasonably controlled Problem #5 diabetes : He is on insulin and his blood sugar is reasonably  controlled. Problem #6 bilateral nephrolithiasis without hydronephrosis. Problem #7 anemia: His hemoglobin and hematocrit has declined most likely from hydration. Since he came with low hemoglobin and hematocrit underlying anemia of iron deficiency cannot ruled out. Plan: We'll DC sodium bicarbonate We'll DC Lasix Will start patient on half normal saline 20 mEq of KCl at 125 cc per hour We'll check basic metabolic panel and iron studies in the morning.    LOS: 2 days   Dustin Bumbaugh S 05/09/2014,8:35 AM

## 2014-05-09 NOTE — Progress Notes (Signed)
[facility] PROGRESS NOTE  LUIE LANEVE BJY:[id number] DOB: 1955-10-22 DOA: 05/07/2014 PCP: Default, Provider, MD  Assessment/Plan: 1. Acute renal failure. Likely multifactorial from rhabdomyolysis, volume depletion, ACE inhibitor. He was started on a bicarbonate infusion which has significantly improved his renal function. Nephrology is following. He is continued on IV fluids and Lasix was added on 8/29. His urine output has been excellent. Continue to hold lisinopril. Discussed with Dr. Kristian Covey and bicarbonate infusion will be changed to saline. 2. Dehydration. Patient was initially hypotensive. Antihypertensive medications are on hold. Continue IV hydration. 3. Rhabdomyolysis. CK level was only mildly elevated. Trending down with IV fluids. 4. Hyperkalemia. Related to renal failure. Improved with Kayexalate. Now he is actually hypokalemic and will require potassium supplementation 5. Lactic acidosis. Likely related to renal failure and concomitant metformin use. Metformin currently on hold. Continue IV hydration. 6. Diabetes. Sliding scale insulin. Hold metformin  Code Status: full code Family Communication: discussed with patient Disposition Plan: discharge home once improved   Consultants:  Nephrology  Procedures:    Antibiotics:    HPI/Subjective: No new complaints  Objective: Filed Vitals:   05/09/14 0813  BP:   Pulse:   Temp: 98.2 F (36.8 C)  Resp:     Intake/Output Summary (Last 24 hours) at 05/09/14 0833 Last data filed at 05/09/14 0641  Gross per 24 hour  Intake 4480.41 ml  Output   5450 ml  Net -969.59 ml   Filed Weights   05/07/14 2138 05/08/14 0500 05/09/14 0500  Weight: 108.3 kg (238 lb 12.1 oz) 112.2 kg (247 lb 5.7 oz) 110 kg (242 lb 8.1 oz)    Exam:   General:  NAD  Cardiovascular: S1, S2 RRR  Respiratory: CTA B  Abdomen: soft, nt, nd, bs+  Musculoskeletal: no edema b/l   Data Reviewed: Basic Metabolic Panel:  Recent  Labs Lab 05/07/14 1624 05/07/14 1940 05/08/14 0056 05/08/14 0548 05/08/14 1232 05/09/14 0528  NA 140  --  142 145 141 144  K 7.2* 5.3 5.7* 4.2 3.6* 3.1*  CL 97  --  103 104 101 96  CO2 13*  --  16* 18* 19 30  GLUCOSE 154*  --  124* 75 187* 119*  BUN 74*  --  77* 76* 74* 61*  CREATININE 8.12*  --  7.67* 7.52* 6.47* 3.54*  CALCIUM 10.0  --  8.6 8.5 8.1* 8.1*  MG  --  2.0  --   --   --   --    Liver Function Tests:  Recent Labs Lab 05/07/14 1624  AST 54*  ALT 33  ALKPHOS 62  BILITOT 0.4  PROT 8.3  ALBUMIN 4.3   No results found for this basename: LIPASE, AMYLASE,  in the last 168 hours No results found for this basename: AMMONIA,  in the last 168 hours CBC:  Recent Labs Lab 05/07/14 1624 05/08/14 0548 05/09/14 0528  WBC 14.2* 11.0* 7.6  NEUTROABS 12.3*  --   --   HGB 13.3 11.3* 10.9*  HCT 39.7 33.8* 32.3*  MCV 80.9 81.1 79.4  PLT 348 284 257   Cardiac Enzymes:  Recent Labs Lab 05/07/14 1624 05/09/14 0600  CKTOTAL 1555* 405*  TROPONINI <0.30  --    BNP (last 3 results) No results found for this basename: PROBNP,  in the last 8760 hours CBG:  Recent Labs Lab 05/08/14 0730 05/08/14 1120 05/08/14 1628 05/08/14 2212 05/09/14 0730  GLUCAP 74 173* 218* 161* 130*    Recent Results (from  the past 240 hour(s))  MRSA PCR SCREENING     Status: None   Collection Time    05/07/14  9:27 PM      Result Value Ref Range Status   MRSA by PCR NEGATIVE  NEGATIVE Final   Comment:            The GeneXpert MRSA Assay (FDA     approved for NASAL specimens     only), is one component of a     comprehensive MRSA colonization     surveillance program. It is not     intended to diagnose MRSA     infection nor to guide or     monitor treatment for     MRSA infections.     Studies: Ct Renal Stone Study  05/07/2014   CLINICAL DATA:  58 year old male with right side low back pain. Syncope. Hypotension. Elevated creatinine. Initial encounter.  EXAM: CT RENAL STONE  PROTOCOL  TECHNIQUE: Multidetector CT imaging of the abdomen and pelvis was performed following the standard protocol without intravenous contrast  COMPARISON:  10/17/2011 CT Abdomen and Pelvis  FINDINGS: No pericardial or pleural effusion.  Mild dependent atelectasis.  No acute osseous abnormality identified.  Stable small fact containing left inguinal hernia. No pelvic free fluid. Decompressed bladder. Sigmoid diverticulosis, no active inflammation identified. Occasional diverticula in the descending colon, no active inflammation. Transverse and right colon within normal limits. Normal appendix. No dilated small bowel. Distended but otherwise negative stomach. Duodenum is decompressed.  Non contrast liver, gallbladder, pancreas, and adrenal glands are within normal limits. No abdominal free fluid.  2-3 mm nephrolithiasis at the right midpole. No right hydronephrosis, perinephric stranding, or abnormality of the right ureter.  4 mm left lower pole and punctate left midpole renal calculi. Chronic anterior midpole mildly exophytic cyst, with simple fluid densitometry. No left perinephric stranding or definite hydronephrosis, there are chronic parapelvic cysts on the left. No abnormality of the left ureter.  No lymphadenopathy identified. Mild Aortoiliac calcified atherosclerosis noted.  IMPRESSION: 1. Bilateral nephrolithiasis.  No obstructive uropathy. 2. Intermittent diverticulosis in the colon, most pronounced in the sigmoid, but no definite active inflammation. 3. Normal appendix.   Electronically Signed   By: Augusto Gamble M.D.   On: 05/07/2014 18:33    Scheduled Meds: . enoxaparin (LOVENOX) injection  30 mg Subcutaneous Q24H  . furosemide  100 mg Intravenous BID  . insulin aspart  0-15 Units Subcutaneous TID WC   Continuous Infusions: .  sodium bicarbonate 150 mEq in sterile water 1000 mL infusion 145 mL/hr at 05/09/14 1610    Active Problems:   ARF (acute renal failure)   Hyperkalemia    Rhabdomyolysis   Lactic acidosis   Acute renal failure   Dehydration    Time spent:    [facility]  [facility] Pager [phone]. If 7PM-7AM, please contact night-coverage at [email or web address], password [facility] 05/09/2014, 8:33 AM  LOS: 2 days

## 2014-05-10 LAB — GLUCOSE, CAPILLARY: Glucose-Capillary: 140 mg/dL — ABNORMAL HIGH (ref 70–99)

## 2014-05-10 LAB — BASIC METABOLIC PANEL
Anion gap: 10 (ref 5–15)
BUN: 29 mg/dL — AB (ref 6–23)
CO2: 32 mEq/L (ref 19–32)
Calcium: 7.7 mg/dL — ABNORMAL LOW (ref 8.4–10.5)
Chloride: 99 mEq/L (ref 96–112)
Creatinine, Ser: 1.21 mg/dL (ref 0.50–1.35)
GFR, EST AFRICAN AMERICAN: 74 mL/min — AB (ref >90)
GFR, EST NON AFRICAN AMERICAN: 64 mL/min — AB (ref >90)
Glucose, Bld: 169 mg/dL — ABNORMAL HIGH (ref 70–99)
Potassium: 3.5 mEq/L — ABNORMAL LOW (ref 3.7–5.3)
SODIUM: 141 meq/L (ref 137–147)

## 2014-05-10 LAB — CBC
HCT: 31.8 % — ABNORMAL LOW (ref 39.0–52.0)
Hemoglobin: 10.6 g/dL — ABNORMAL LOW (ref 13.0–17.0)
MCH: 27.2 pg (ref 26.0–34.0)
MCHC: 33.3 g/dL (ref 30.0–36.0)
MCV: 81.5 fL (ref 78.0–100.0)
Platelets: 211 K/uL (ref 150–400)
RBC: 3.9 MIL/uL — ABNORMAL LOW (ref 4.22–5.81)
RDW: 14.8 % (ref 11.5–15.5)
WBC: 6.5 K/uL (ref 4.0–10.5)

## 2014-05-10 MED ORDER — POTASSIUM CHLORIDE CRYS ER 20 MEQ PO TBCR
40.0000 meq | EXTENDED_RELEASE_TABLET | Freq: Once | ORAL | Status: AC
Start: 2014-05-10 — End: 2014-05-10
  Administered 2014-05-10: 40 meq via ORAL
  Filled 2014-05-10: qty 2

## 2014-05-10 NOTE — Discharge Summary (Signed)
Physician Discharge Summary  Kenneth Walker UEA:[id number] DOB: 12-Jun-1956 DOA: 05/07/2014  PCP: Default, Provider, MD has primary care doctor in [place]  Admit date: 05/07/2014 Discharge date: 05/10/2014  Time spent: 40 minutes  Recommendations for Outpatient Follow-up:  1. Follow up with primary care doctor in 1-2 weeks for repeat BMET.  ACE inhibitor has been held for now  Discharge Diagnoses:  Active Problems:   ARF (acute renal failure)   Hyperkalemia   Rhabdomyolysis   Lactic acidosis   Acute renal failure   Dehydration   Discharge Condition: improved  Diet recommendation: low salt, low carb  Filed Weights   05/08/14 0500 05/09/14 0500 05/10/14 0500  Weight: 112.2 kg (247 lb 5.7 oz) 110 kg (242 lb 8.1 oz) 107.5 kg (236 lb 15.9 oz)    History of present illness and hospital course:  This patient was admitted to the hospital after he had a syncopal episode followed by hypotension with systolic blood pressure dropping into the 70s when he stood up. 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 He noted decreased urine output and dark concentrated urine. He was evaluated in the emergency room where he was noted to have acute renal failure with a creatinine of 8.12 and a potassium of 7.2. CK levels were mildly elevated at 1500. It was felt that his renal failure was combination of dehydration, concomitant ACE inhibitor use and mild rhabdomyolysis. He was aggressively hydrated with IV fluids. He was followed by nephrology during his hospital stay. His renal function improved and renal failure has now resolved. He is advised to continue hydrating self, especially when working in the heat. We will hold on restarting his ACE inhibitor for now until he follows up with primary care doctor and has repeat labs drawn. Remainder of his medications will be continued. Patient is stable for discharge  home  Procedures:    Consultations:  Nephrology  Discharge Exam: Filed Vitals:   05/10/14 0808  BP:   Pulse:   Temp: 99.7 F (37.6 C)  Resp:     General: NAD Cardiovascular: S1, S2 RRR Respiratory: CTA B  Discharge Instructions You were cared for by a hospitalist during your hospital stay. If you have any questions about your discharge medications or the care you received while you were in the hospital after you are discharged, you can call the unit and asked to speak with the hospitalist on call if the hospitalist that took care of you is not available. Once you are discharged, your primary care physician will handle any further medical issues. Please note that NO REFILLS for any discharge medications will be authorized once you are discharged, as it is imperative that you return to your primary care physician (or establish a relationship with a primary care physician if you do not have one) for your aftercare needs so that they can reassess your need for medications and monitor your lab values.  Discharge Instructions   Call MD for:  extreme fatigue    Complete by:  As directed      Call MD for:  persistant dizziness or light-headedness    Complete by:  As directed      Call MD for:  persistant nausea and vomiting    Complete by:  As directed      Diet - low sodium heart healthy    Complete by:  As directed      Diet Carb Modified  Complete by:  As directed      Increase activity slowly    Complete by:  As directed             Medication List    STOP taking these medications       lisinopril 5 MG tablet  Commonly known as:  PRINIVIL,ZESTRIL      TAKE these medications       atenolol 25 MG tablet  Commonly known as:  TENORMIN  Take 12.5 mg by mouth daily.     cholecalciferol 1000 UNITS tablet  Commonly known as:  VITAMIN D  Take 1,000 Units by mouth daily.     LORazepam 1 MG tablet  Commonly known as:  ATIVAN  Take 0.5 mg by mouth daily as needed.  For anxiety     metFORMIN 1000 MG tablet  Commonly known as:  GLUCOPHAGE  Take 1,000 mg by mouth 2 (two) times daily with a meal.     simvastatin 40 MG tablet  Commonly known as:  ZOCOR  Take 20 mg by mouth every evening.       Allergies  Allergen Reactions  . Penicillins Hives       Follow-up Information   Follow up with follow up with primary care doctor in 1-2 weeks for repeat blood test to check kidney function.       The results of significant diagnostics from this hospitalization (including imaging, microbiology, ancillary and laboratory) are listed below for reference.    Significant Diagnostic Studies: Ct Renal Stone Study  05/07/2014   CLINICAL DATA:  58 year old male with right side low back pain. Syncope. Hypotension. Elevated creatinine. Initial encounter.  EXAM: CT RENAL STONE PROTOCOL  TECHNIQUE: Multidetector CT imaging of the abdomen and pelvis was performed following the standard protocol without intravenous contrast  COMPARISON:  10/17/2011 CT Abdomen and Pelvis  FINDINGS: No pericardial or pleural effusion.  Mild dependent atelectasis.  No acute osseous abnormality identified.  Stable small fact containing left inguinal hernia. No pelvic free fluid. Decompressed bladder. Sigmoid diverticulosis, no active inflammation identified. Occasional diverticula in the descending colon, no active inflammation. Transverse and right colon within normal limits. Normal appendix. No dilated small bowel. Distended but otherwise negative stomach. Duodenum is decompressed.  Non contrast liver, gallbladder, pancreas, and adrenal glands are within normal limits. No abdominal free fluid.  2-3 mm nephrolithiasis at the right midpole. No right hydronephrosis, perinephric stranding, or abnormality of the right ureter.  4 mm left lower pole and punctate left midpole renal calculi. Chronic anterior midpole mildly exophytic cyst, with simple fluid densitometry. No left perinephric stranding or  definite hydronephrosis, there are chronic parapelvic cysts on the left. No abnormality of the left ureter.  No lymphadenopathy identified. Mild Aortoiliac calcified atherosclerosis noted.  IMPRESSION: 1. Bilateral nephrolithiasis.  No obstructive uropathy. 2. Intermittent diverticulosis in the colon, most pronounced in the sigmoid, but no definite active inflammation. 3. Normal appendix.   Electronically Signed   By: Augusto Gamble M.D.   On: 05/07/2014 18:33    Microbiology: Recent Results (from the past 240 hour(s))  MRSA PCR SCREENING     Status: None   Collection Time    05/07/14  9:27 PM      Result Value Ref Range Status   MRSA by PCR NEGATIVE  NEGATIVE Final   Comment:            The GeneXpert MRSA Assay (FDA     approved for NASAL specimens  only), is one component of a     comprehensive MRSA colonization     surveillance program. It is not     intended to diagnose MRSA     infection nor to guide or     monitor treatment for     MRSA infections.     Labs: Basic Metabolic Panel:  Recent Labs Lab 05/07/14 1624 05/07/14 1940 05/08/14 0056 05/08/14 0548 05/08/14 1232 05/09/14 0528 05/10/14 0527  NA 140  --  142 145 141 144 141  K 7.2* 5.3 5.7* 4.2 3.6* 3.1* 3.5*  CL 97  --  103 104 101 96 99  CO2 13*  --  16* 18* 19 30 32  GLUCOSE 154*  --  124* 75 187* 119* 169*  BUN 74*  --  77* 76* 74* 61* 29*  CREATININE 8.12*  --  7.67* 7.52* 6.47* 3.54* 1.21  CALCIUM 10.0  --  8.6 8.5 8.1* 8.1* 7.7*  MG  --  2.0  --   --   --   --   --    Liver Function Tests:  Recent Labs Lab 05/07/14 1624  AST 54*  ALT 33  ALKPHOS 62  BILITOT 0.4  PROT 8.3  ALBUMIN 4.3   No results found for this basename: LIPASE, AMYLASE,  in the last 168 hours No results found for this basename: AMMONIA,  in the last 168 hours CBC:  Recent Labs Lab 05/07/14 1624 05/08/14 0548 05/09/14 0528 05/10/14 0527  WBC 14.2* 11.0* 7.6 6.5  NEUTROABS 12.3*  --   --   --   HGB 13.3 11.3* 10.9* 10.6*   HCT 39.7 33.8* 32.3* 31.8*  MCV 80.9 81.1 79.4 81.5  PLT 348 284 257 211   Cardiac Enzymes:  Recent Labs Lab 05/07/14 1624 05/09/14 0600  CKTOTAL 1555* 405*  TROPONINI <0.30  --    BNP: BNP (last 3 results) No results found for this basename: PROBNP,  in the last 8760 hours CBG:  Recent Labs Lab 05/09/14 0730 05/09/14 1127 05/09/14 1643 05/09/14 2101 05/10/14 0726  GLUCAP 130* 222* 209* 156* 140*       Signed:  Mikayla Chiusano  [facility] 05/10/2014, 8:59 AM

## 2014-05-10 NOTE — Progress Notes (Signed)
Subjective: Interval History: Patient feels better and does not any complaint.  Objective: Vital signs in last 24 hours: Temp:  [98.2 F (36.8 C)-99.5 F (37.5 C)] 99.5 F (37.5 C) (08/31 0400) Pulse Rate:  [49-94] 67 (08/31 0400) Resp:  [13-26] 19 (08/31 0400) BP: (80-160)/(42-96) 116/64 mmHg (08/31 0400) SpO2:  [84 %-99 %] 99 % (08/31 0400) Weight:  [107.5 kg (236 lb 15.9 oz)] 107.5 kg (236 lb 15.9 oz) (08/31 0500) Weight change: -2.5 kg (-5 lb 8.2 oz)  Intake/Output from previous day: 08/30 0701 - 08/31 0700 In: 2749.2 [P.O.:720; I.V.:2029.2] Out: 3025 [Urine:3025] Intake/Output this shift:    General appearance: alert, cooperative and no distress Resp: clear to auscultation bilaterally Cardio: regular rate and rhythm, S1, S2 normal, no murmur, click, rub or gallop Extremities: extremities normal, atraumatic, no cyanosis or edema  Lab Results:  Recent Labs  05/09/14 0528 05/10/14 0527  WBC 7.6 6.5  HGB 10.9* 10.6*  HCT 32.3* 31.8*  PLT 257 211   BMET:   Recent Labs  05/09/14 0528 05/10/14 0527  NA 144 141  K 3.1* 3.5*  CL 96 99  CO2 30 32  GLUCOSE 119* 169*  BUN 61* 29*  CREATININE 3.54* 1.21  CALCIUM 8.1* 7.7*   No results found for this basename: PTH,  in the last 72 hours Iron Studies: No results found for this basename: IRON, TIBC, TRANSFERRIN, FERRITIN,  in the last 72 hours  Studies/Results: No results found.  I have reviewed the patient's current medications.  Assessment/Plan: Problem #1 acute kidney injury: His renal function has recovered Problem #2 metabolic acidosis: patient off sodium bicarbonate and metabolic acidosis has resoved Problem #3 hypokalemia: His potassium supplement his potassium level is low normal Problem #4 hypertension: His blood pressure is reasonably controlled Problem #5 diabetes : He is on insulin and his blood sugar is reasonably controlled. Problem #6 bilateral nephrolithiasis without hydronephrosis. Problem #7  anemia: His hemoglobin and hematocrit is low but stable Plan: We'll DC ivf with potassium Patient advised to increase his fluid intake Since his renal function has recovered I will sign off.  Thank you for letting participate in his care.   LOS: 3 days   Elleanna Melling S 05/10/2014,7:40 AM

## 2014-05-10 NOTE — Discharge Instructions (Signed)
Acute Kidney Injury Acute kidney injury is a disease in which there is sudden (acute) damage to the kidneys. The kidneys are 2 organs that lie on either side of the spine between the middle of the back and the front of the abdomen. The kidneys:  Remove wastes and extra water from the blood.   Produce important hormones. These help keep bones strong, regulate blood pressure, and help create red blood cells.   Balance the fluids and chemicals in the blood and tissues. A small amount of kidney damage may not cause problems, but a large amount of damage may make it difficult or impossible for the kidneys to work the way they should. Acute kidney injury may develop into long-lasting (chronic) kidney disease. It may also develop into a life-threatening disease called end-stage kidney disease. Acute kidney injury can get worse very quickly, so it should be treated right away. Early treatment may prevent other kidney diseases from developing.  CAUSES   A problem with blood flow to the kidneys. This may be caused by:   Blood loss.   Heart disease.   Severe burns.   Liver disease.  Direct damage to the kidneys. This may be caused by:  Some medicines.   A kidney infection.   Poisoning or consuming toxic substances.   A surgical wound.   A blow to the kidney area.   A problem with urine flow. This may be caused by:   Cancer.   Kidney stones.   An enlarged prostate. SYMPTOMS   Swelling (edema) of the legs, ankles, or feet.   Tiredness (lethargy).   Nausea or vomiting.   Confusion.   Problems with urination, such as:   Painful or burning feeling during urination.   Decreased urine production.   Frequent accidents in children who are potty trained.   Bloody urine.   Muscle twitches and cramps.   Shortness of breath.   Seizures.   Chest pain or pressure. Sometimes, no symptoms are present. DIAGNOSIS Acute kidney injury may be detected  and diagnosed by tests, including blood, urine, imaging, or kidney biopsy tests.  TREATMENT Treatment of acute kidney injury varies depending on the cause and severity of the kidney damage. In mild cases, no treatment may be needed. The kidneys may heal on their own. If acute kidney injury is more severe, your caregiver will treat the cause of the kidney damage, help the kidneys heal, and prevent complications from occurring. Severe cases may require a procedure to remove toxic wastes from the body (dialysis) or surgery to repair kidney damage. Surgery may involve:   Repair of a torn kidney.   Removal of an obstruction. Most of the time, you will need to stay overnight at the hospital.  HOME CARE INSTRUCTIONS:  Follow your prescribed diet.  Only take over-the-counter or prescription medicines as directed by your caregiver.  Do not take any new medicines (prescription, over-the-counter, or nutritional supplements) unless approved by your caregiver. Many medicines can worsen your kidney damage or need to have the dose adjusted.   Keep all follow-up appointments as directed by your caregiver.  Observe your condition to make sure you are healing as expected. SEEK IMMEDIATE MEDICAL CARE IF:  You are feeling ill or have severe pain in the back or side.   Your symptoms return or you have new symptoms.  You have any symptoms of end-stage kidney disease. These include:   Persistent itchiness.   Loss of appetite.   Headaches.   Abnormally dark   or light skin.  Numbness in the hands or feet.   Easy bruising.   Frequent hiccups.   Menstruation stops.   You have a fever.  You have increased urine production.  You have pain or bleeding when urinating. MAKE SURE YOU:   Understand these instructions.  Will watch your condition.  Will get help right away if you are not doing well or get worse Document Released: 03/12/2011 Document Revised: 12/22/2012 Document  Reviewed: 04/25/2012 Exit[employer] Patient Information 2015 [employer]. This information is not intended to replace advice given to you by your health care provider. Make sure you discuss any questions you have with your health care provider.  

## 2014-05-11 LAB — MYOGLOBIN, URINE: MYOGLOBIN UR: 116 ug/L — AB (ref <28)

## 2017-11-08 DEATH — deceased
